# Patient Record
Sex: Female | Born: 1975 | Hispanic: No | Marital: Married | Smoking: Never smoker
Health system: Southern US, Community
[De-identification: ages and names within clinical notes are randomized; demographics above are authoritative.]

## PROBLEM LIST (undated history)

## (undated) DIAGNOSIS — D649 Anemia, unspecified: Secondary | ICD-10-CM

## (undated) DIAGNOSIS — I10 Essential (primary) hypertension: Secondary | ICD-10-CM

## (undated) DIAGNOSIS — E119 Type 2 diabetes mellitus without complications: Secondary | ICD-10-CM

## (undated) HISTORY — DX: Type 2 diabetes mellitus without complications: E11.9

## (undated) HISTORY — DX: Essential (primary) hypertension: I10

## (undated) HISTORY — DX: Anemia, unspecified: D64.9

---

## 2015-04-27 DIAGNOSIS — E119 Type 2 diabetes mellitus without complications: Secondary | ICD-10-CM

## 2015-04-27 HISTORY — DX: Type 2 diabetes mellitus without complications: E11.9

## 2015-04-27 HISTORY — PX: BREAST CYST ASPIRATION: SHX578

## 2015-08-27 ENCOUNTER — Other Ambulatory Visit: Payer: Self-pay | Admitting: Family Medicine

## 2015-08-27 DIAGNOSIS — N63 Unspecified lump in unspecified breast: Secondary | ICD-10-CM

## 2015-09-10 ENCOUNTER — Ambulatory Visit
Admission: RE | Admit: 2015-09-10 | Discharge: 2015-09-10 | Disposition: A | Discharge status: Home / Self Care | Payer: Self-pay | Source: Ambulatory Visit | Attending: Oncology | Admitting: Oncology

## 2015-09-10 ENCOUNTER — Ambulatory Visit: Payer: Self-pay | Attending: Oncology

## 2015-09-10 VITALS — BP 132/89 | HR 74 | Temp 98.1°F | Resp 18 | Ht 65.75 in | Wt 174.7 lb

## 2015-09-10 DIAGNOSIS — N63 Unspecified lump in unspecified breast: Secondary | ICD-10-CM

## 2015-09-10 NOTE — Progress Notes (Signed)
Subjective:     Patient ID: Kristi Compton, female   DOB: 24-Aug-1975, 40 y.o.   MRN: [id number]  HPI   Review of Systems     Objective:   Physical Exam  Pulmonary/Chest: Right breast exhibits no inverted nipple, no mass, no nipple discharge, no skin change and no tenderness. Left breast exhibits mass. Left breast exhibits no inverted nipple, no nipple discharge, no skin change and no tenderness. Breasts are symmetrical.         Assessment:     40 year old [facility].  Patient screened, and meets [facility] eligibility.  Patient does not have insurance, Medicare or Medicaid.  Handout given on Affordable Care Act. Instructed patient on breast self-exam using teach back method.  Palpated a 3cmx4cm left breast mass at 12 o'clock adjacent to areola.  Patient states she has notice for 3 weeks.  She also had a pap performed  At [facility], and was placed on Metronidazole, but states without relief.  Requested notes from that visit.  Joellyn QuailsChristy Burton to assist finding primary care physician to follow for continued vaginal itching , pain.     Plan:     Sent for bilateral diagnostic mammogram, and breast ultrasound.

## 2015-09-11 ENCOUNTER — Other Ambulatory Visit: Payer: Self-pay

## 2015-09-11 DIAGNOSIS — N63 Unspecified lump in unspecified breast: Secondary | ICD-10-CM

## 2015-09-19 ENCOUNTER — Ambulatory Visit
Admission: RE | Admit: 2015-09-19 | Discharge: 2015-09-19 | Disposition: A | Discharge status: Home / Self Care | Payer: Self-pay | Source: Ambulatory Visit | Attending: Oncology | Admitting: Oncology

## 2015-09-19 DIAGNOSIS — N63 Unspecified lump in unspecified breast: Secondary | ICD-10-CM

## 2015-09-24 ENCOUNTER — Ambulatory Visit: Payer: Self-pay

## 2015-09-25 ENCOUNTER — Ambulatory Visit: Payer: Self-pay

## 2015-10-06 ENCOUNTER — Encounter: Payer: Self-pay | Admitting: Obstetrics and Gynecology

## 2015-10-07 NOTE — Progress Notes (Signed)
Birads 2 mammogram, and FNA cyst aspiration results given to patient by radiologist.  She is given instruction to return in one year for annual screening, and mammogram. Copy to [facility].

## 2015-10-29 ENCOUNTER — Encounter: Payer: Self-pay | Admitting: Obstetrics and Gynecology

## 2016-09-15 ENCOUNTER — Ambulatory Visit
Admission: RE | Admit: 2016-09-15 | Discharge: 2016-09-15 | Disposition: A | Discharge status: Home / Self Care | Payer: Self-pay | Source: Ambulatory Visit | Attending: Oncology | Admitting: Oncology

## 2016-09-15 ENCOUNTER — Ambulatory Visit: Payer: Self-pay | Attending: Oncology

## 2016-09-15 ENCOUNTER — Encounter (INDEPENDENT_AMBULATORY_CARE_PROVIDER_SITE_OTHER): Payer: Self-pay

## 2016-09-15 VITALS — BP 135/95 | HR 83 | Temp 97.9°F | Resp 18 | Ht 66.0 in | Wt 175.0 lb

## 2016-09-15 DIAGNOSIS — Z0000 Encounter for general adult medical examination without abnormal findings: Secondary | ICD-10-CM

## 2016-09-15 NOTE — Progress Notes (Signed)
Subjective:     Patient ID: Kristi Compton, female   DOB: Jul 15, 1975, 41 y.o.   MRN: [id number]  HPI   Review of Systems     Objective:   Physical Exam  Pulmonary/Chest: Right breast exhibits mass. Right breast exhibits no inverted nipple, no nipple discharge, no skin change and no tenderness. Left breast exhibits no inverted nipple, no mass, no nipple discharge, no skin change and no tenderness. Breasts are symmetrical.    Right breast cysts upper outer quadrant        Assessment:     41 year old patient presents for [facility] clinic visit.  Noted bilateral breast cysts on previous mammogram. Patient had left breast cyst aspirated. Patient screened, and meets [facility] eligibility.  Patient does not have insurance, Medicare or Medicaid.  Handout given on Affordable Care Act. Instructed patient on breast self-exam using teach back method.  Palpated right breast cysts upper outer quadrant. Palpated bilateral symmetrical fibroglandular tissue surrounding areola.  Patient having generalized bilateral breast tenderness.    Reports having hot flashes.  States menstrual cycles are not always regular, and reports sometimes heavy bleeding.  Discussed perimenopause, and need to follow-up with GYN or primary physician.  Patient does not have primary physicia.  She is going to [place] to visit her ailing father.  Recommended she set up appointment with PCP when she returns.  Offered a list of primary care physicians. Plan:     Sent for bilateral screening mammogram.  Patient is having menstrual cycle, and will call Kristi Compton to schedule pap.

## 2016-09-21 NOTE — Progress Notes (Signed)
Letter mailed from [facility] to notify of normal mammogram results.  Patient to return in one year for annual screening.  Copy to [facility]. 

## 2017-06-10 IMAGING — US US BREAST*R* LIMITED INC AXILLA
1 series · 10 of 10 positions shown · non-contrast
Comparison: None.

CLINICAL DATA: 40-year-old female complaining of a palpable left
breast mass.

EXAM:
2D DIGITAL DIAGNOSTIC BILATERAL MAMMOGRAM WITH CAD AND ADJUNCT TOMO
ULTRASOUND BILATERAL BREAST

[Series 1: us breast*right* limited inc axilla · 0.08mm/px · 10 of 10 slices shown]
[im 1/10]
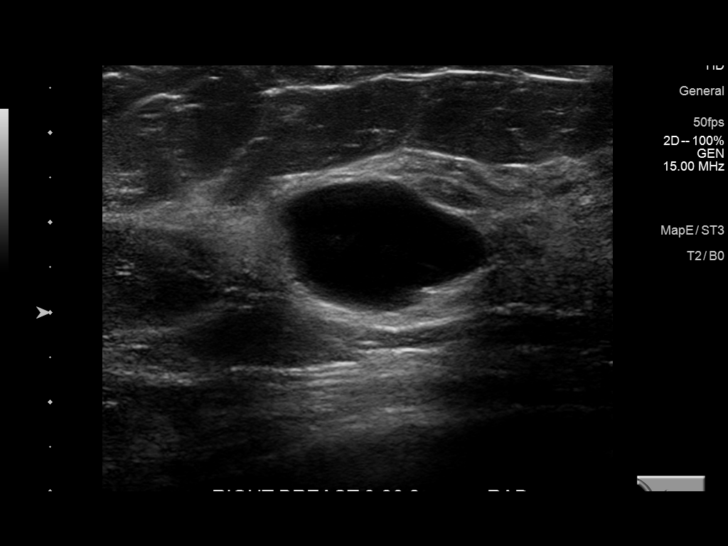
[im 2/10]
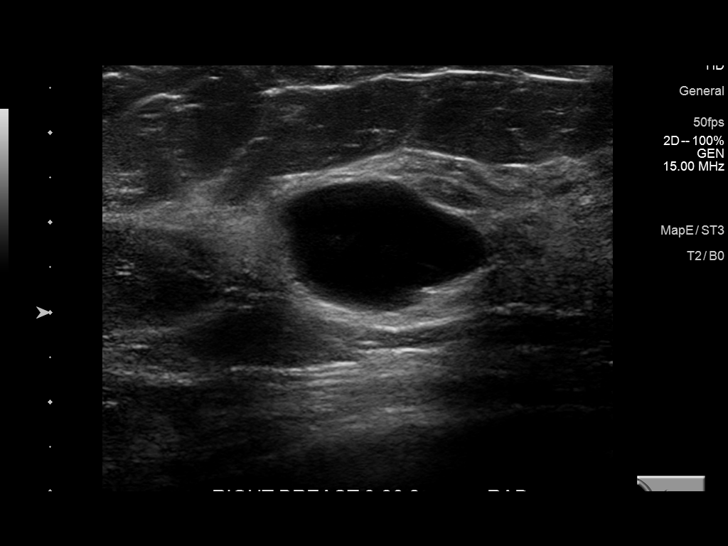
[im 3/10]
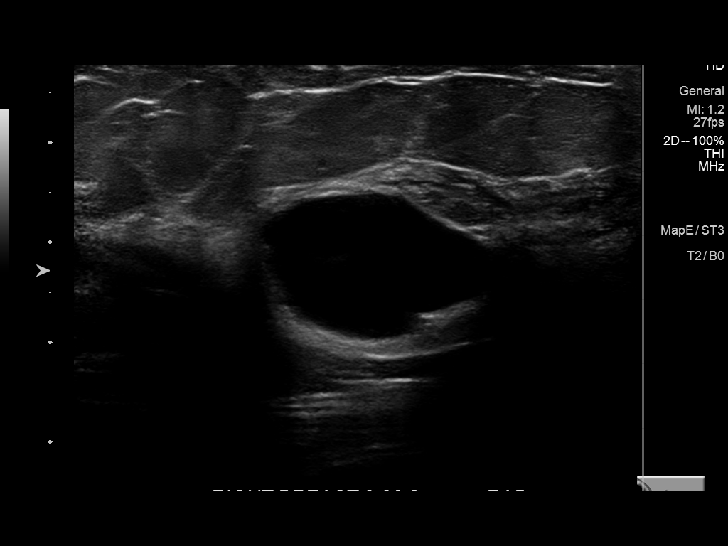
[im 4/10]
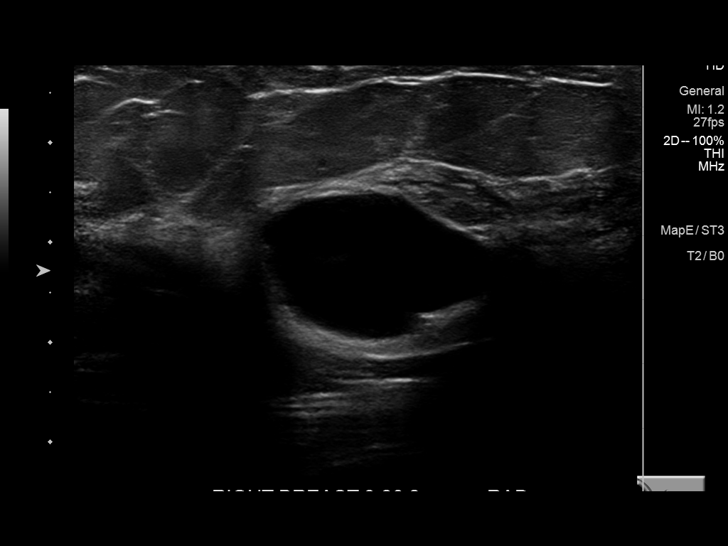
[im 5/10]
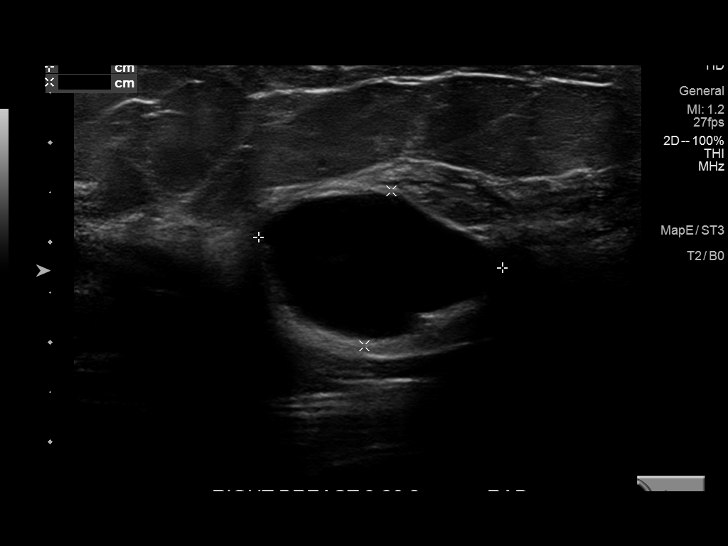
[im 6/10]
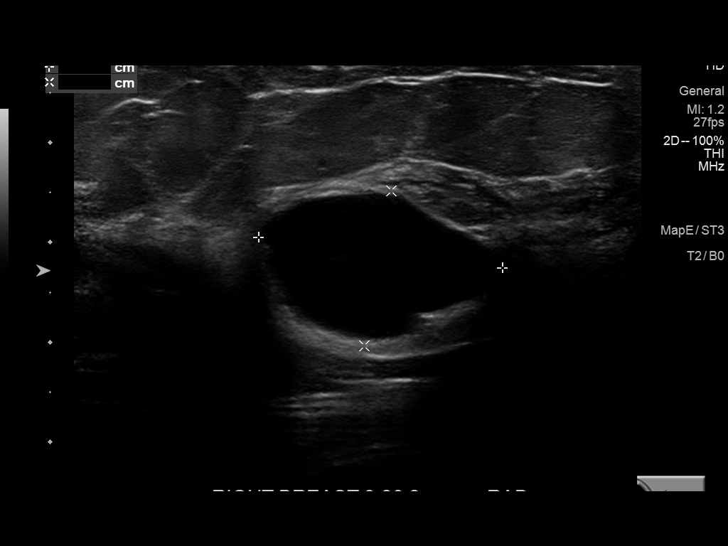
[im 7/10]
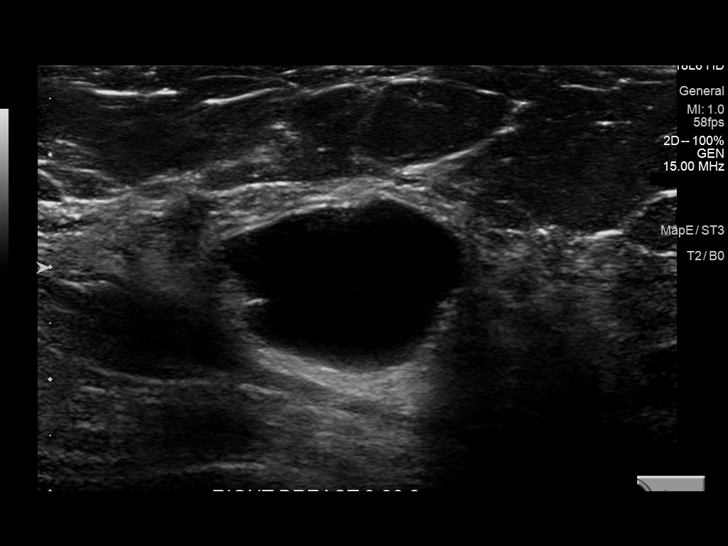
[im 8/10]
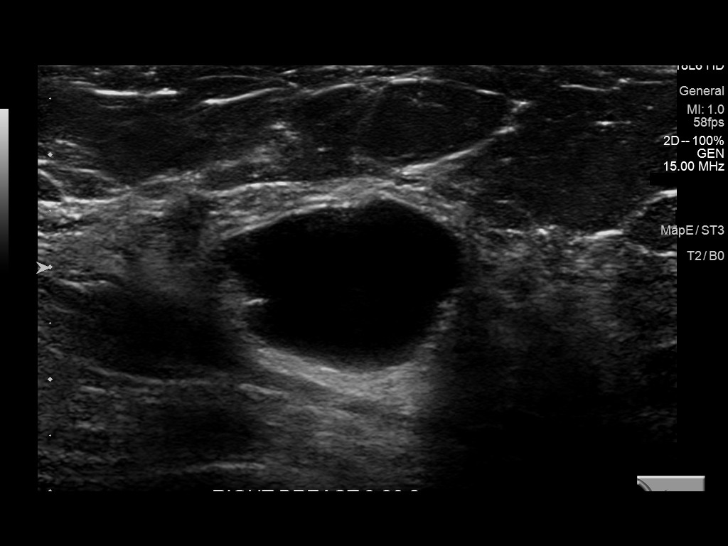
[im 9/10]
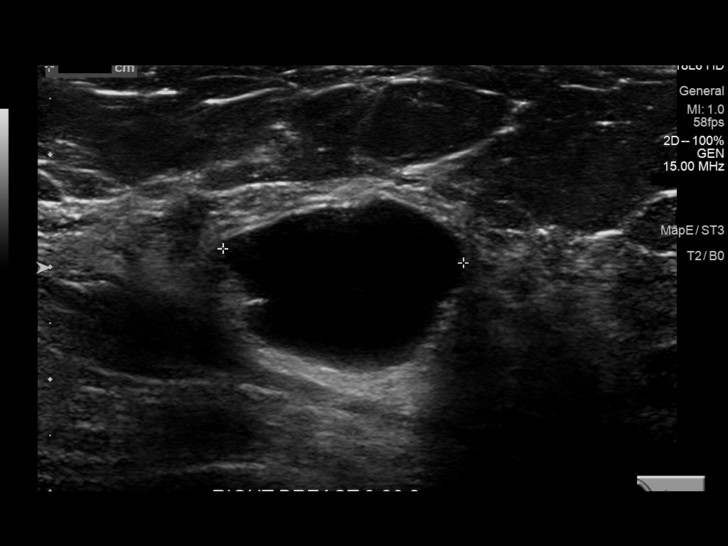
[im 10/10]
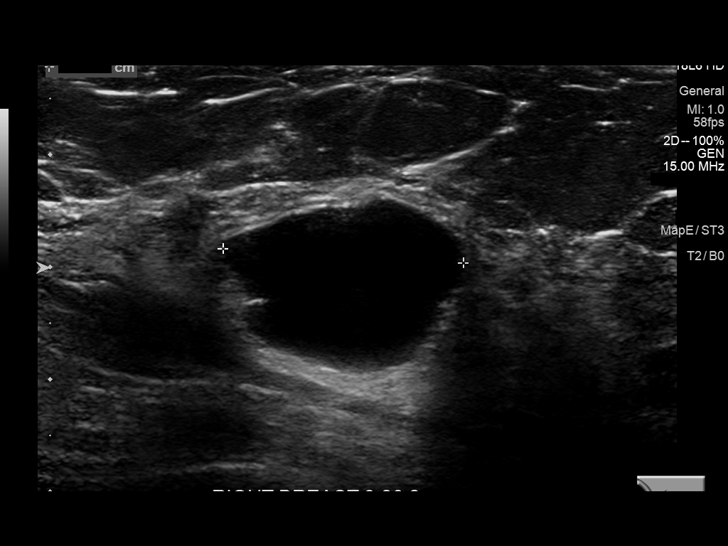

[10 of 10 positions shown; findings below may reference images not displayed]

ACR Breast Density Category c: The breast tissue is heterogeneously
dense, which may obscure small masses.
FINDINGS: There is a well-circumscribed obscured 2.4 cm mass in the
upper-outer quadrant of the right breast and a well circumscribed
obscured 3.4 cm mass in the 12 o'clock region of the left breast.
There are no malignant type microcalcifications in either breast.

Mammographic images were processed with CAD.

On physical exam, I do not palpate a mass in the upper-outer
quadrant of right breast. I palpate thickening in the left breast at
12 o'clock 3 cm from the nipple.

Targeted ultrasound is performed, showing a well-circumscribed
anechoic cyst in the right breast at 9 o'clock 8 cm from the nipple
measuring 2.5 x 1.6 x 2.1 cm. Sonographic evaluation of the left
breast shows a well-circumscribed anechoic cyst at 12 o'clock 3 cm
from the nipple measuring 3.1 x 2.9 x 3.0 cm. There is an adjacent
smaller cyst. No solid mass or abnormal shadowing detected in either
breast.
IMPRESSION: Bilateral breast cysts.  No evidence of malignancy.

RECOMMENDATION:
Bilateral screening mammogram in 1 year is recommended.

I have discussed the findings and recommendations with the patient.
Results were also provided in writing at the conclusion of the
visit. If applicable, a reminder letter will be sent to the patient
regarding the next appointment.

BI-RADS CATEGORY  2: Benign.

## 2017-09-21 ENCOUNTER — Ambulatory Visit: Payer: Self-pay

## 2017-10-05 ENCOUNTER — Ambulatory Visit: Payer: Self-pay | Attending: Oncology | Admitting: *Deleted

## 2017-10-05 VITALS — BP 158/112 | HR 82 | Temp 98.7°F | Ht 65.0 in | Wt 178.0 lb

## 2017-10-05 DIAGNOSIS — Z0000 Encounter for general adult medical examination without abnormal findings: Secondary | ICD-10-CM

## 2017-10-05 DIAGNOSIS — N63 Unspecified lump in unspecified breast: Secondary | ICD-10-CM

## 2017-10-05 NOTE — Patient Instructions (Signed)
HPV Test The human papillomavirus (HPV) test is used to look for high-risk types of HPV infection. HPV is a group of about 100 viruses. Many of these viruses cause growths on, in, or around the genitals. Most HPV viruses cause infections that usually go away without treatment. However, HPV types 6, 11, 16, and 18 are considered high-risk types of HPV that can increase your risk of cancer of the cervix or anus if the infection is left untreated. An HPV test identifies the DNA (genetic) strands of the HPV infection, so it is also referred to as the HPV DNA test. Although HPV is found in both males and females, the HPV test is only used to screen for increased cancer risk in females:  With an abnormal Pap test.  After treatment of an abnormal Pap test.  Between the ages of 53 and 66.  After treatment of a high-risk HPV infection.  The HPV test may be done at the same time as a pelvic exam and Pap test in females over the age of 61. Both the HPV test and Pap test require a sample of cells from the cervix. How do I prepare for this test?  Do not douche or take a bath for 24-48 hours before the test or as directed by your health care provider.  Do not have sex for 24-48 hours before the test or as directed by your health care provider.  You may be asked to reschedule the test if you are menstruating.  You will be asked to urinate before the test. What do the results mean? It is your responsibility to obtain your test results. Ask the lab or department performing the test when and how you will get your results. Talk with your health care provider if you have any questions about your results. Your result will be negative or positive. Meaning of Negative Test Results A negative HPV test result means that no HPV was found, and it is very likely that you do not have HPV. Meaning of Positive Test Results A positive HPV test result indicates that you have HPV.  If your test result shows the presence  of any high-risk HPV strains, you may have an increased risk of developing cancer of the cervix or anus if the infection is left untreated.  If any low-risk HPV strains are found, you are not likely to have an increased risk of cancer.  Discuss your test results with your health care provider. He or she will use the results to make a diagnosis and determine a treatment plan that is right for you. Talk with your health care provider to discuss your results, treatment options, and if necessary, the need for more tests. Talk with your health care provider if you have any questions about your results. This information is not intended to replace advice given to you by your health care provider. Make sure you discuss any questions you have with your health care provider. Document Released: 05/07/2004 Document Revised: 12/17/2015 Document Reviewed: 08/28/2013 Elsevier Interactive Patient Education  2018 [employer]. Hypertension Hypertension, commonly called high blood pressure, is when the force of blood pumping through the arteries is too strong. The arteries are the blood vessels that carry blood from the heart throughout the body. Hypertension forces the heart to work harder to pump blood and may cause arteries to become narrow or stiff. Having untreated or uncontrolled hypertension can cause heart attacks, strokes, kidney disease, and other problems. A blood pressure reading consists of a  higher number over a lower number. Ideally, your blood pressure should be below 120/80. The first ("top") number is called the systolic pressure. It is a measure of the pressure in your arteries as your heart beats. The second ("bottom") number is called the diastolic pressure. It is a measure of the pressure in your arteries as the heart relaxes. What are the causes? The cause of this condition is not known. What increases the risk? Some risk factors for high blood pressure are under your control. Others are  not. Factors you can change  Smoking.  Having type 2 diabetes mellitus, high cholesterol, or both.  Not getting enough exercise or physical activity.  Being overweight.  Having too much fat, sugar, calories, or salt (sodium) in your diet.  Drinking too much alcohol. Factors that are difficult or impossible to change  Having chronic kidney disease.  Having a family history of high blood pressure.  Age. Risk increases with age.  Race. You may be at higher risk if you are African-American.  Gender. Men are at higher risk than women before age 43. After age 43, women are at higher risk than men.  Having obstructive sleep apnea.  Stress. What are the signs or symptoms? Extremely high blood pressure (hypertensive crisis) may cause:  Headache.  Anxiety.  Shortness of breath.  Nosebleed.  Nausea and vomiting.  Severe chest pain.  Jerky movements you cannot control (seizures).  How is this diagnosed? This condition is diagnosed by measuring your blood pressure while you are seated, with your arm resting on a surface. The cuff of the blood pressure monitor will be placed directly against the skin of your upper arm at the level of your heart. It should be measured at least twice using the same arm. Certain conditions can cause a difference in blood pressure between your right and left arms. Certain factors can cause blood pressure readings to be lower or higher than normal (elevated) for a short period of time:  When your blood pressure is higher when you are in a health care provider's office than when you are at home, this is called white coat hypertension. Most people with this condition do not need medicines.  When your blood pressure is higher at home than when you are in a health care provider's office, this is called masked hypertension. Most people with this condition may need medicines to control blood pressure.  If you have a high blood pressure reading during one  visit or you have normal blood pressure with other risk factors:  You may be asked to return on a different day to have your blood pressure checked again.  You may be asked to monitor your blood pressure at home for 1 week or longer.  If you are diagnosed with hypertension, you may have other blood or imaging tests to help your health care provider understand your overall risk for other conditions. How is this treated? This condition is treated by making healthy lifestyle changes, such as eating healthy foods, exercising more, and reducing your alcohol intake. Your health care provider may prescribe medicine if lifestyle changes are not enough to get your blood pressure under control, and if:  Your systolic blood pressure is above 130.  Your diastolic blood pressure is above 80.  Your personal target blood pressure may vary depending on your medical conditions, your age, and other factors. Follow these instructions at home: Eating and drinking  Eat a diet that is high in fiber and potassium, and  low in sodium, added sugar, and fat. An example eating plan is called the DASH (Dietary Approaches to Stop Hypertension) diet. To eat this way: ? Eat plenty of fresh fruits and vegetables. Try to fill half of your plate at each meal with fruits and vegetables. ? Eat whole grains, such as whole wheat pasta, brown rice, or whole grain bread. Fill about one quarter of your plate with whole grains. ? Eat or drink low-fat dairy products, such as skim milk or low-fat yogurt. ? Avoid fatty cuts of meat, processed or cured meats, and poultry with skin. Fill about one quarter of your plate with lean proteins, such as fish, chicken without skin, beans, eggs, and tofu. ? Avoid premade and processed foods. These tend to be higher in sodium, added sugar, and fat.  Reduce your daily sodium intake. Most people with hypertension should eat less than 1,500 mg of sodium a day.  Limit alcohol intake to no more than 1  drink a day for nonpregnant women and 2 drinks a day for men. One drink equals 12 oz of beer, 5 oz of wine, or 1 oz of hard liquor. Lifestyle  Work with your health care provider to maintain a healthy body weight or to lose weight. Ask what an ideal weight is for you.  Get at least 30 minutes of exercise that causes your heart to beat faster (aerobic exercise) most days of the week. Activities may include walking, swimming, or biking.  Include exercise to strengthen your muscles (resistance exercise), such as pilates or lifting weights, as part of your weekly exercise routine. Try to do these types of exercises for 30 minutes at least 3 days a week.  Do not use any products that contain nicotine or tobacco, such as cigarettes and e-cigarettes. If you need help quitting, ask your health care provider.  Monitor your blood pressure at home as told by your health care provider.  Keep all follow-up visits as told by your health care provider. This is important. Medicines  Take over-the-counter and prescription medicines only as told by your health care provider. Follow directions carefully. Blood pressure medicines must be taken as prescribed.  Do not skip doses of blood pressure medicine. Doing this puts you at risk for problems and can make the medicine less effective.  Ask your health care provider about side effects or reactions to medicines that you should watch for. Contact a health care provider if:  You think you are having a reaction to a medicine you are taking.  You have headaches that keep coming back (recurring).  You feel dizzy.  You have swelling in your ankles.  You have trouble with your vision. Get help right away if:  You develop a severe headache or confusion.  You have unusual weakness or numbness.  You feel faint.  You have severe pain in your chest or abdomen.  You vomit repeatedly.  You have trouble breathing. Summary  Hypertension is when the force  of blood pumping through your arteries is too strong. If this condition is not controlled, it may put you at risk for serious complications.  Your personal target blood pressure may vary depending on your medical conditions, your age, and other factors. For most people, a normal blood pressure is less than 120/80.  Hypertension is treated with lifestyle changes, medicines, or a combination of both. Lifestyle changes include weight loss, eating a healthy, low-sodium diet, exercising more, and limiting alcohol. This information is not intended to replace advice  given to you by your health care provider. Make sure you discuss any questions you have with your health care provider. Document Released: 04/12/2005 Document Revised: 03/10/2016 Document Reviewed: 03/10/2016 Elsevier Interactive Patient Education  [employer]2018 Elsevier Inc.

## 2017-10-05 NOTE — Progress Notes (Signed)
  Subjective:     Patient ID: Kristi Compton, female   DOB: [birth year], 42 y.o.   MRN: [id number]  HPI   Review of Systems     Objective:   Physical Exam  Pulmonary/Chest: Right breast exhibits mass and tenderness. Right breast exhibits no inverted nipple, no nipple discharge and no skin change. Left breast exhibits tenderness. Left breast exhibits no inverted nipple, no mass, no nipple discharge and no skin change.    Abdominal: There is no splenomegaly or hepatomegaly.  Genitourinary: Rectal exam shows external hemorrhoid. No labial fusion. There is no rash, tenderness, lesion or injury on the right labia. There is no rash, tenderness, lesion or injury on the left labia. Uterus is not deviated, not enlarged, not fixed and not tender. Cervix exhibits no motion tenderness, no discharge and no friability. Right adnexum displays no mass, no tenderness and no fullness. Left adnexum displays no mass, no tenderness and no fullness. No erythema, tenderness or bleeding in the vagina. No foreign body in the vagina. No signs of injury around the vagina. Vaginal discharge found.  Genitourinary Comments: White non-odorous discharge noted       Assessment:     42 year old [place] from [place] [facility]  Complains of intermittent bilateral breast pain.  History of bilateral breast cysts with aspiration.  States "I can't sleep on my stomach" due to the pain.  On clinical breast exam I can palpate an approximate 3 cm tender mobile nodule at 9-10:00 right breast 4 cm from the areola.  Left lateral breast with tenderness on exam also.  Taught self breast awareness. The patient has never had a pap smear and is very anxious about having one completed today.  On pelvic exam I had difficulty visualizing the cervix.  Had second RN, Coralee Rudnne Shaver come in, and she was able to visualize the cervix.  Specimen collected for pap smear.  The patient refused rectal exam due to her hemorrhoids.   Blood pressure elevated at 158/112.  She is to take her blood pressure meds as soon as possible and then recheck her blood pressure at [facility] or [facility], and if remains higher than 140/90 she is to follow-up with her primary care provider.  Hand out on hypertention given to patient.  Patient has been screened for eligibility.  She does not have any insurance, Medicare or Medicaid.  She also meets financial eligibility.  Hand-out given on the Affordable Care Act.    Plan:     Bilateral diagnostic mammogram and ultrasound ordered.  Specimen for baseline pap sent to the lab.  To recheck her blood pressure.  Will follow-up per [facility] protocol.

## 2017-10-13 ENCOUNTER — Ambulatory Visit
Admission: RE | Admit: 2017-10-13 | Discharge: 2017-10-13 | Disposition: A | Discharge status: Home / Self Care | Payer: Self-pay | Source: Ambulatory Visit | Attending: Oncology | Admitting: Oncology

## 2017-10-13 DIAGNOSIS — N63 Unspecified lump in unspecified breast: Secondary | ICD-10-CM

## 2017-10-13 LAB — PAP LB AND HPV HIGH-RISK
HPV, HIGH-RISK: NEGATIVE
PAP Smear Comment: 0

## 2017-10-13 NOTE — Progress Notes (Signed)
Phoned patient with Birads 2 mammogram/ultrasound results showing bilateral cysts.  Patient is interested surgical consult for cyst aspiration if covered through [facility].  Given negative/negative pap results.  Next pap due in 5 years per [facility] guidelines.

## 2017-10-18 ENCOUNTER — Encounter: Payer: Self-pay | Admitting: *Deleted

## 2017-10-18 NOTE — Progress Notes (Signed)
Spoke to patient today.  She is still having a lot of breast pain.  Since her mammogram showed bilateral breast cysts, I feel it would be prudent to have her see a surgeon for possible cyst aspiration.  Patient is scheduled to see Dr. Lemar LivingsByrnett on 11/01/17 At 9:30.

## 2017-11-01 ENCOUNTER — Encounter: Payer: Self-pay | Admitting: General Surgery

## 2017-11-01 ENCOUNTER — Ambulatory Visit (INDEPENDENT_AMBULATORY_CARE_PROVIDER_SITE_OTHER): Payer: Self-pay | Admitting: General Surgery

## 2017-11-01 VITALS — BP 130/70 | HR 82 | Resp 12 | Ht 66.0 in | Wt 175.0 lb

## 2017-11-01 DIAGNOSIS — N644 Mastodynia: Secondary | ICD-10-CM

## 2017-11-01 NOTE — Progress Notes (Signed)
Patient ID: Kristi Compton, female   DOB: [birth year], 42 y.o.   MRN: [id number]  Chief Complaint  Patient presents with  . Mass    HPI Kristi Compton is a 42 y.o. female who presents for a breast evaluation. The most recent mammogram was done on 10/13/2017. Patient does perform regular self breast checks and gets regular mammograms done. In 2017, she had a left breast aspiration. The area was very painful.  She states she is has multiple cyst and they are painful sometime went she sleep. The breast are more tender in the last two years. She drinks a significant amount of tea, which she reports is common with mint and sweetened with sugar in her culture from [place].  She made it quite clear that her tea was more important than breast tenderness. HPI  Past Medical History:  Diagnosis Date  . Diabetes mellitus without complication (HCC) 2017  . Hypertension     Past Surgical History:  Procedure Laterality Date  . BREAST CYST ASPIRATION Left 2017    Family History  Problem Relation Age of Onset  . Breast cancer Neg Hx     Social History Social History   Tobacco Use  . Smoking status: Never Smoker  . Smokeless tobacco: Never Used  Substance Use Topics  . Alcohol use: Never    Frequency: Never  . Drug use: Not on file    No Known Allergies  Current Outpatient Medications  Medication Sig Dispense Refill  . metFORMIN (GLUCOPHAGE) 500 MG tablet Take 500 mg by mouth 2 (two) times daily with a meal.     No current facility-administered medications for this visit.     Review of Systems Review of Systems  Constitutional: Negative.   Respiratory: Negative.   Cardiovascular: Negative.     Blood pressure 130/70, pulse 82, resp. rate 12, height 5\' 6"  (1.676 m), weight 175 lb (79.4 kg), last menstrual period 10/11/2017.  Physical Exam Physical Exam  Constitutional: She is oriented to person, place, and time. She appears well-developed and well-nourished.  Eyes: Conjunctivae  are normal. No scleral icterus.  Neck: Neck supple.  Cardiovascular: Normal rate, regular rhythm and normal heart sounds.  Pulmonary/Chest: Effort normal and breath sounds normal. Right breast exhibits no inverted nipple, no nipple discharge, no skin change and no tenderness. Left breast exhibits no inverted nipple, no nipple discharge, no skin change and no tenderness.    Lymphadenopathy:    She has no cervical adenopathy.  Neurological: She is alert and oriented to person, place, and time.  Skin: Skin is warm and dry.    Data Reviewed November 12, 2017 bilateral mammograms and ultrasound reviewed.  Mammograms were benign, multiple cysts on ultrasound.  No direct correlation with areas of maximal tenderness in the left lateral breast.  Assessment    Mastalgia, fibrocystic changes.    Plan   Patient to take 1 Advil  a couple hours before bedtime. Try to wear a light bra at night to try to help the breast not move. Less caffeine.  Try an antioxidant vitamin BID for 3 months. Return as needed.    HPI, Physical Exam, Assessment and Plan have been scribed under the direction and in the presence of Donnalee CurryJeffrey Dahiana Kulak, MD.  Ples SpecterJessica Qualls, CMA  I have completed the exam and reviewed the above documentation for accuracy and completeness.  I agree with the above.  [employer] has been used and any errors in dictation or transcription are unintentional.  Donnalee CurryJeffrey Odai Wimmer, M.D., F.A.C.S.  Merrily Pew Branndon Tuite 11/02/2017, 5:35 PM

## 2017-11-01 NOTE — Patient Instructions (Addendum)
Patient to take 1 Advil  a couple hours before bedtime. Try to wear a light bra at night to try to help the breast not move. Less caffeine.  Try a vitamin like antioxidant vitamin.Return as needed.

## 2017-11-02 DIAGNOSIS — N644 Mastodynia: Secondary | ICD-10-CM | POA: Insufficient documentation

## 2017-11-03 ENCOUNTER — Encounter: Payer: Self-pay | Admitting: *Deleted

## 2017-11-03 NOTE — Progress Notes (Signed)
Patient saw Dr. Lemar LivingsByrnett for evaluation of breast pain.  To follow up in one year with annual screening.  [facility] to [place].

## 2018-01-10 IMAGING — US US BREAST*L* LIMITED INC AXILLA
1 series · 9 of 9 positions shown · non-contrast
Comparison: None.

CLINICAL DATA: 40-year-old female complaining of a palpable left
breast mass.

EXAM:
2D DIGITAL DIAGNOSTIC BILATERAL MAMMOGRAM WITH CAD AND ADJUNCT TOMO
ULTRASOUND BILATERAL BREAST

[Series 1: us breast*left* limited inc axilla · 0.09mm/px · 9 of 9 slices shown]
[im 1/9]
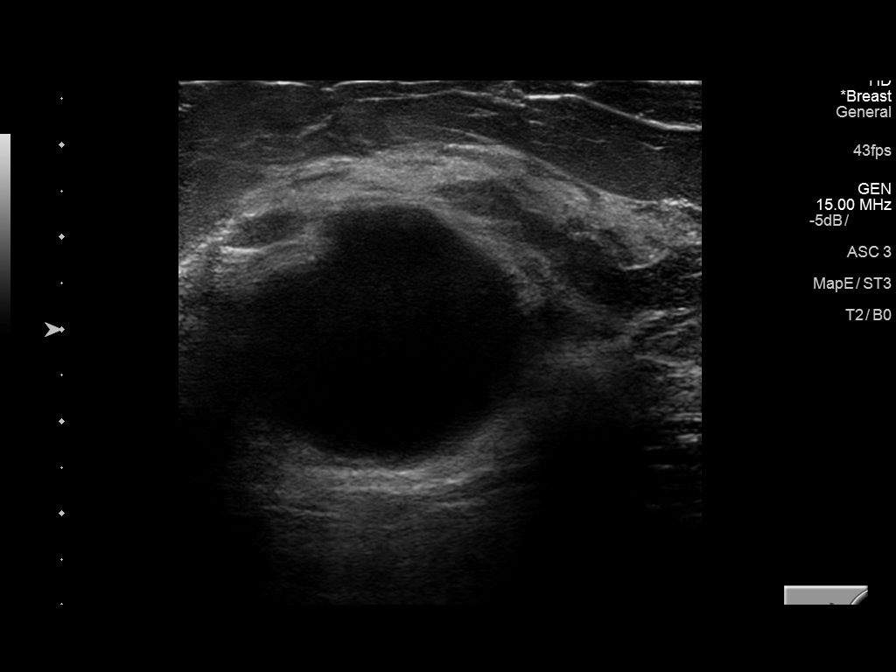
[im 2/9]
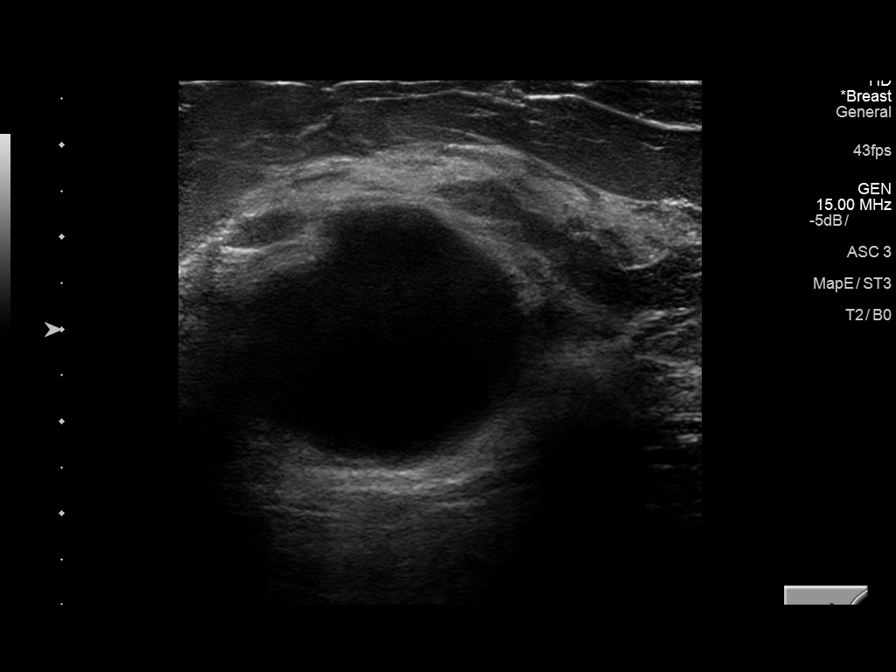
[im 3/9]
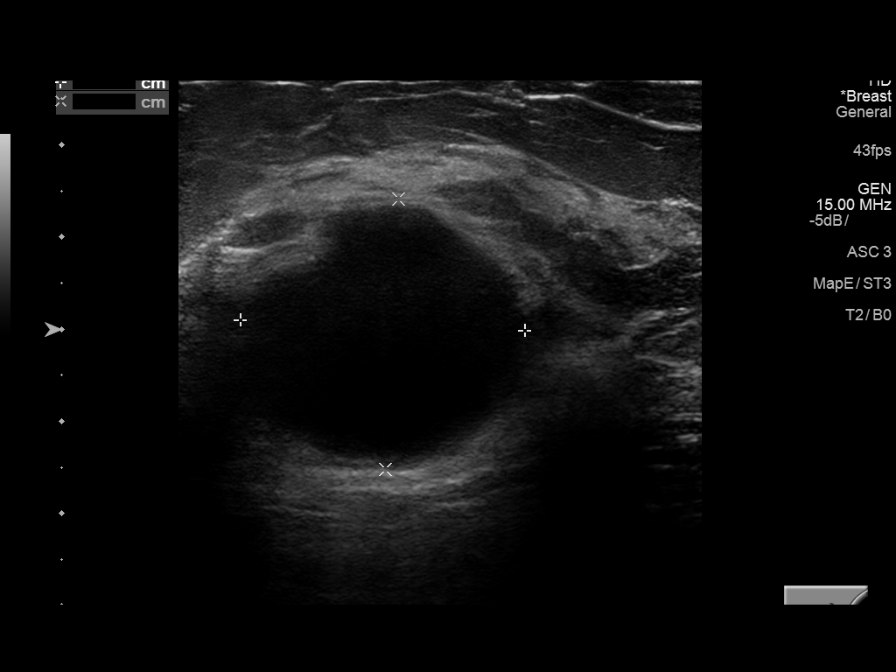
[im 4/9]
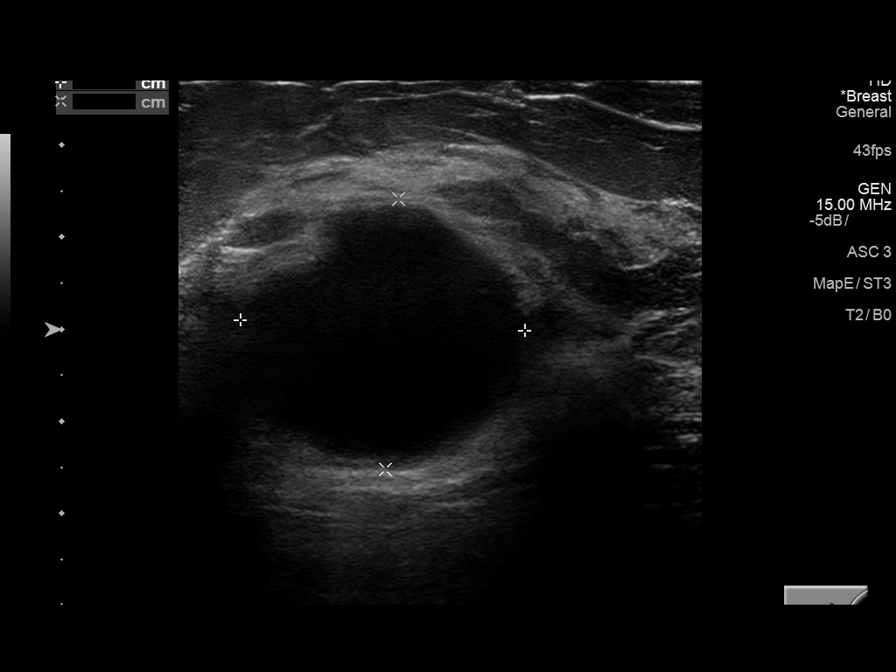
[im 5/9]
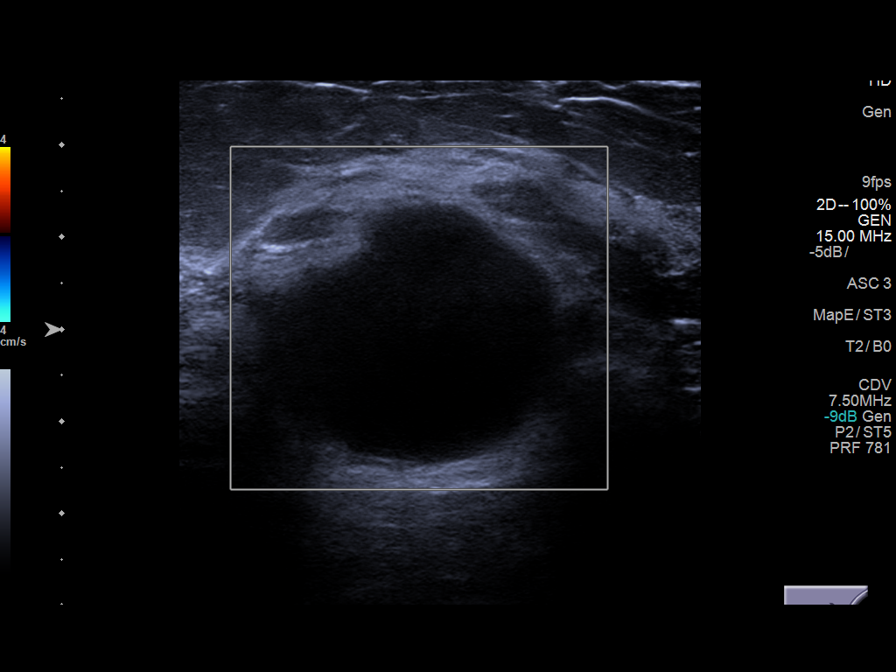
[im 6/9]
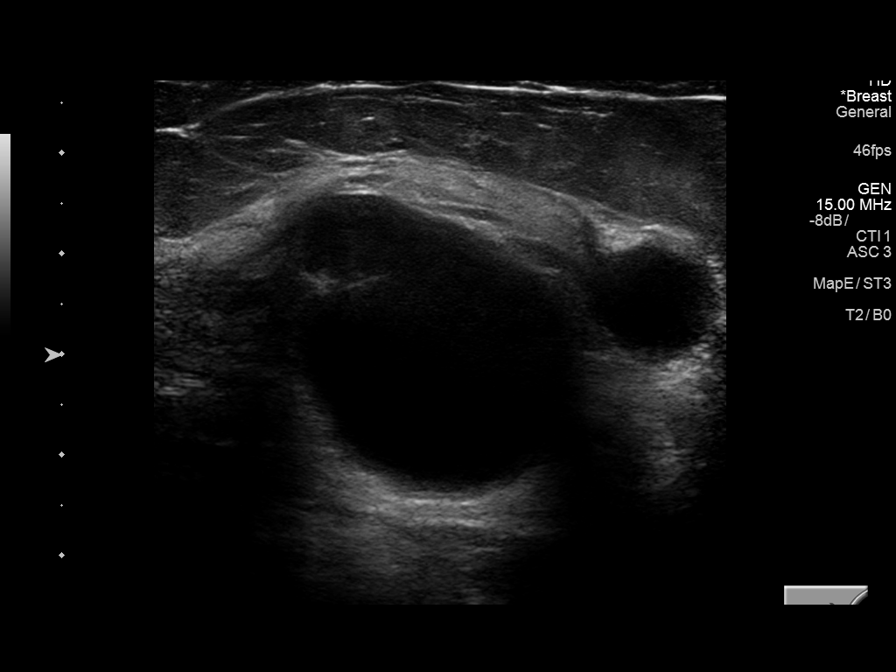
[im 7/9]
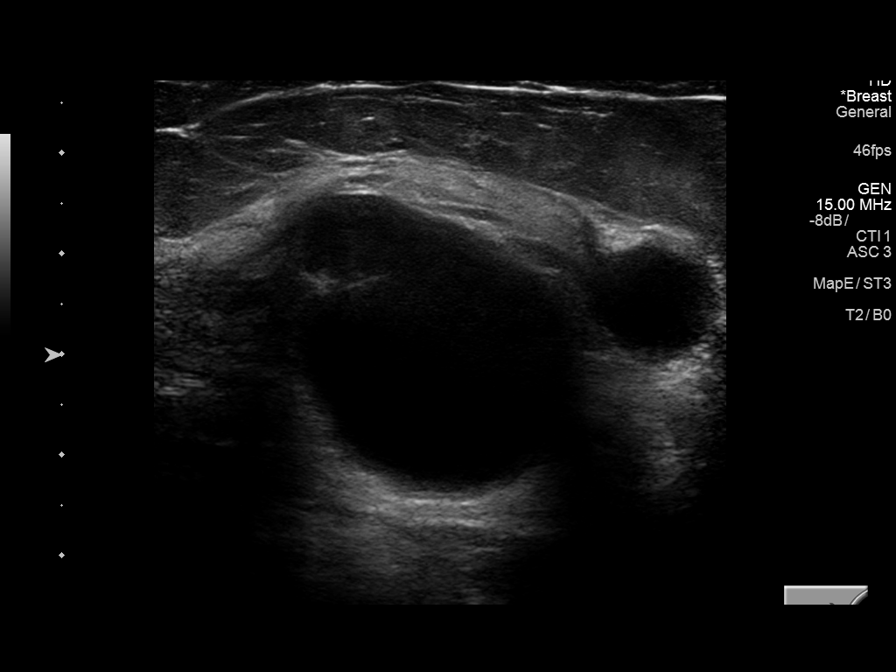
[im 8/9]
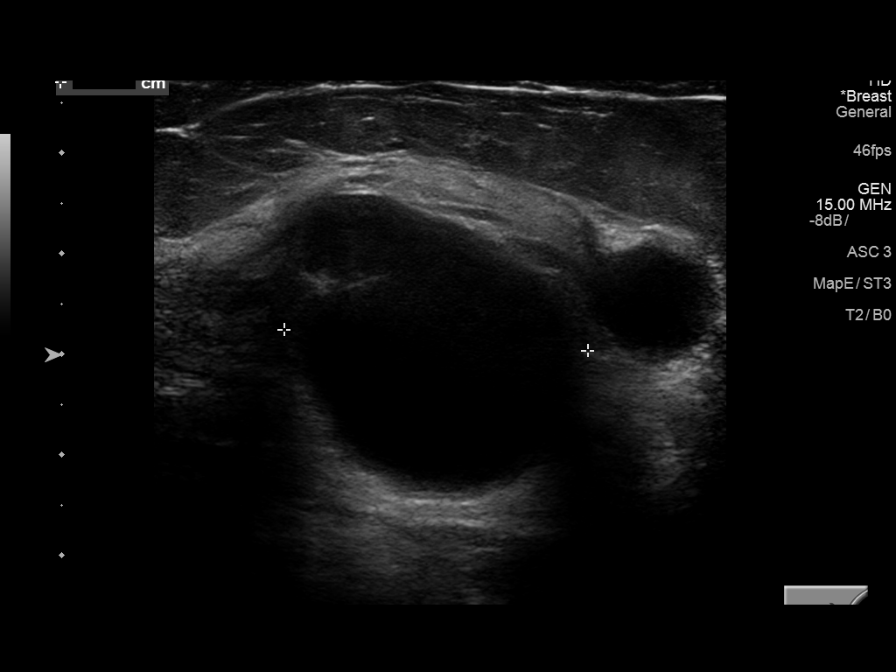
[im 9/9]
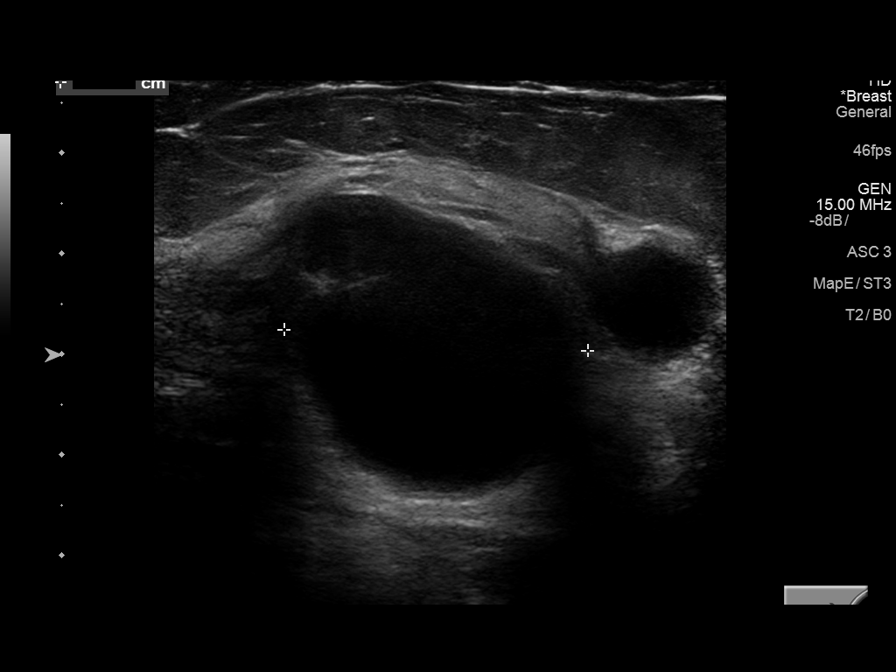

[9 of 9 positions shown; findings below may reference images not displayed]

ACR Breast Density Category c: The breast tissue is heterogeneously
dense, which may obscure small masses.
FINDINGS: There is a well-circumscribed obscured 2.4 cm mass in the
upper-outer quadrant of the right breast and a well circumscribed
obscured 3.4 cm mass in the 12 o'clock region of the left breast.
There are no malignant type microcalcifications in either breast.

Mammographic images were processed with CAD.

On physical exam, I do not palpate a mass in the upper-outer
quadrant of right breast. I palpate thickening in the left breast at
12 o'clock 3 cm from the nipple.

Targeted ultrasound is performed, showing a well-circumscribed
anechoic cyst in the right breast at 9 o'clock 8 cm from the nipple
measuring 2.5 x 1.6 x 2.1 cm. Sonographic evaluation of the left
breast shows a well-circumscribed anechoic cyst at 12 o'clock 3 cm
from the nipple measuring 3.1 x 2.9 x 3.0 cm. There is an adjacent
smaller cyst. No solid mass or abnormal shadowing detected in either
breast.
IMPRESSION: Bilateral breast cysts.  No evidence of malignancy.

RECOMMENDATION:
Bilateral screening mammogram in 1 year is recommended.

I have discussed the findings and recommendations with the patient.
Results were also provided in writing at the conclusion of the
visit. If applicable, a reminder letter will be sent to the patient
regarding the next appointment.

BI-RADS CATEGORY  2: Benign.

## 2018-02-22 ENCOUNTER — Encounter: Payer: Self-pay | Admitting: Internal Medicine

## 2018-02-25 ENCOUNTER — Ambulatory Visit: Payer: Self-pay | Admitting: Internal Medicine

## 2018-02-25 VITALS — BP 120/65 | Temp 98.7°F | Ht 66.0 in | Wt 178.0 lb

## 2018-02-25 DIAGNOSIS — E139 Other specified diabetes mellitus without complications: Secondary | ICD-10-CM

## 2018-02-25 DIAGNOSIS — E119 Type 2 diabetes mellitus without complications: Secondary | ICD-10-CM

## 2018-02-25 DIAGNOSIS — E785 Hyperlipidemia, unspecified: Secondary | ICD-10-CM

## 2018-02-25 MED ORDER — DOXYCYCLINE HYCLATE 100 MG PO TABS: 100.0000 mg | ORAL_TABLET | Freq: Two times a day (BID) | ORAL | 0 refills | Status: DC

## 2018-02-25 NOTE — Progress Notes (Signed)
H/o HTN and DM comes with 1 month h/o sore throat, occasionally spits up dark blood in the morning. Takes Aleve or Tylenol for pain relief sometimes.    Exam:  Mild redness with no signs of infection.  Plan; - She is a mouth breather, since going on for a month she wants to try antibiotic. Will give her Doxycycline.  - Routine labs since Diabetic and has hyperlipidemia

## 2018-03-14 ENCOUNTER — Encounter: Payer: Self-pay | Admitting: Internal Medicine

## 2018-04-01 ENCOUNTER — Encounter: Payer: Self-pay | Admitting: Family Medicine

## 2018-04-01 ENCOUNTER — Ambulatory Visit: Payer: Self-pay | Admitting: Family Medicine

## 2018-04-01 VITALS — BP 120/101 | HR 81 | Temp 98.4°F | Ht 66.0 in | Wt 178.8 lb

## 2018-04-01 DIAGNOSIS — E119 Type 2 diabetes mellitus without complications: Secondary | ICD-10-CM

## 2018-04-01 NOTE — Progress Notes (Signed)
  Subjective:     Patient ID: Kristi Compton, female   DOB: 1975/10/12, 42 y.o.   MRN: [id number]  HPI Patient here for follow up for diabetes. Says that she feels like she has to eat all the time. No weight loss or gain. A1C is 7.3. Taking metformin 2 tablets of 500 mg in the morning.   Review of Systems     Objective:   Physical Exam  Eyes: Pupils are equal, round, and reactive to light.  Cardiovascular: Regular rhythm.  Pulmonary/Chest: Effort normal.  Musculoskeletal: Normal range of motion.       Assessment:     diabetes    Plan:     A1C slightly above the desired. Increase exercise, may need to increase metformin If next A1C is still elevated.

## 2018-06-09 DIAGNOSIS — R059 Cough, unspecified: Secondary | ICD-10-CM | POA: Insufficient documentation

## 2018-06-09 DIAGNOSIS — R05 Cough: Secondary | ICD-10-CM | POA: Insufficient documentation

## 2019-03-02 DIAGNOSIS — I10 Essential (primary) hypertension: Secondary | ICD-10-CM | POA: Insufficient documentation

## 2019-03-02 DIAGNOSIS — E119 Type 2 diabetes mellitus without complications: Secondary | ICD-10-CM | POA: Insufficient documentation

## 2019-03-03 ENCOUNTER — Telehealth: Payer: Self-pay | Admitting: Internal Medicine

## 2019-03-03 ENCOUNTER — Ambulatory Visit: Payer: Self-pay | Admitting: Internal Medicine

## 2019-03-03 DIAGNOSIS — I10 Essential (primary) hypertension: Secondary | ICD-10-CM

## 2019-03-17 ENCOUNTER — Ambulatory Visit: Payer: Self-pay | Admitting: Internal Medicine

## 2019-03-28 ENCOUNTER — Other Ambulatory Visit: Payer: Self-pay | Admitting: Internal Medicine

## 2019-03-29 LAB — COMPREHENSIVE METABOLIC PANEL
ALT: 17 IU/L (ref 0–32)
AST: 19 IU/L (ref 0–40)
Albumin/Globulin Ratio: 1.5 (ref 1.2–2.2)
Albumin: 4.2 g/dL (ref 3.8–4.8)
Alkaline Phosphatase: 73 IU/L (ref 39–117)
BUN/Creatinine Ratio: 13 (ref 9–23)
BUN: 7 mg/dL (ref 6–24)
Bilirubin Total: 0.6 mg/dL (ref 0.0–1.2)
CO2: 23 mmol/L (ref 20–29)
Calcium: 9 mg/dL (ref 8.7–10.2)
Chloride: 100 mmol/L (ref 96–106)
Creatinine, Ser: 0.56 mg/dL — ABNORMAL LOW (ref 0.57–1.00)
GFR calc Af Amer: 131 mL/min/{1.73_m2} (ref >59)
GFR calc non Af Amer: 114 mL/min/{1.73_m2} (ref >59)
Globulin, Total: 2.8 g/dL (ref 1.5–4.5)
Glucose: 145 mg/dL — ABNORMAL HIGH (ref 65–99)
Potassium: 4.4 mmol/L (ref 3.5–5.2)
Sodium: 138 mmol/L (ref 134–144)
Total Protein: 7 g/dL (ref 6.0–8.5)

## 2019-03-29 LAB — CBC
Hematocrit: 34.5 % (ref 34.0–46.6)
Hemoglobin: 9.8 g/dL — ABNORMAL LOW (ref 11.1–15.9)
MCH: 19.5 pg — ABNORMAL LOW (ref 26.6–33.0)
MCHC: 28.4 g/dL — ABNORMAL LOW (ref 31.5–35.7)
MCV: 69 fL — ABNORMAL LOW (ref 79–97)
Platelets: 314 10*3/uL (ref 150–450)
RBC: 5.02 x10E6/uL (ref 3.77–5.28)
RDW: 19.1 % — ABNORMAL HIGH (ref 11.7–15.4)
WBC: 7.3 10*3/uL (ref 3.4–10.8)

## 2019-03-29 LAB — HGB A1C W/O EAG: Hgb A1c MFr Bld: 6.9 % — ABNORMAL HIGH (ref 4.8–5.6)

## 2019-03-29 LAB — TSH: TSH: 2.65 u[IU]/mL (ref 0.450–4.500)

## 2019-03-31 ENCOUNTER — Ambulatory Visit: Payer: Self-pay | Admitting: Internal Medicine

## 2019-03-31 DIAGNOSIS — E119 Type 2 diabetes mellitus without complications: Secondary | ICD-10-CM

## 2019-03-31 DIAGNOSIS — I10 Essential (primary) hypertension: Secondary | ICD-10-CM

## 2019-03-31 MED ORDER — METFORMIN HCL 500 MG PO TABS: 500.0000 mg | ORAL_TABLET | Freq: Two times a day (BID) | ORAL | 3 refills | Status: DC

## 2019-03-31 MED ORDER — LISINOPRIL 10 MG PO TABS: 10.0000 mg | ORAL_TABLET | Freq: Every day | ORAL | 3 refills | Status: DC

## 2019-03-31 NOTE — Progress Notes (Addendum)
Established Patient Office Visit  Subjective:  Patient ID: Kristi Compton, female    DOB: [birth year]  Age: 43 y.o. MRN: [id number]  CC: For follow-up of her diabetes and hypertension.  HPI Kristi Compton presents for for follow-up of her diabetes and hypertension.  She has stopped taking all her meds for about a year now.  She is trying to control her chronic conditions with diet and exercise.  She does not check her CBG at home. She do check her blood pressure occasionally at home.  Today it was 145/94.  Per patient it was much improved than her prior readings of 431-540 systolic. She has no other complaints.  Past Medical History:  Diagnosis Date  . Diabetes mellitus without complication ([place]) 0867  . Hypertension     Past Surgical History:  Procedure Laterality Date  . BREAST CYST ASPIRATION Left 2017    Family History  Problem Relation Age of Onset  . Breast cancer Neg Hx     Social History   Socioeconomic History  . Marital status: Married    Spouse name: Not on file  . Number of children: Not on file  . Years of education: Not on file  . Highest education level: Not on file  Occupational History  . Not on file  Social Needs  . Financial resource strain: Not on file  . Food insecurity    Worry: Not on file    Inability: Not on file  . Transportation needs    Medical: Not on file    Non-medical: Not on file  Tobacco Use  . Smoking status: Never Smoker  . Smokeless tobacco: Never Used  Substance and Sexual Activity  . Alcohol use: Never    Frequency: Never  . Drug use: Not on file  . Sexual activity: Not on file  Lifestyle  . Physical activity    Days per week: Not on file    Minutes per session: Not on file  . Stress: Not on file  Relationships  . Social Herbalist on phone: Not on file    Gets together: Not on file    Attends religious service: Not on file    Active member of club or organization: Not on file    Attends meetings of  clubs or organizations: Not on file    Relationship status: Not on file  . Intimate partner violence    Fear of current or ex partner: Not on file    Emotionally abused: Not on file    Physically abused: Not on file    Forced sexual activity: Not on file  Other Topics Concern  . Not on file  Social History Narrative  . Not on file    Outpatient Medications Prior to Visit  Medication Sig Dispense Refill  . doxycycline (VIBRA-TABS) 100 MG tablet Take 1 tablet (100 mg total) by mouth 2 (two) times daily. (Patient not taking: Reported on 04/01/2018) 14 tablet 0   No facility-administered medications prior to visit.     No Known Allergies  ROS Review of Systems    Objective:    Physical Exam  There were no vitals taken for this visit. Wt Readings from Last 3 Encounters:  07/09/17 180 lb 3.2 oz (81.7 kg)  04/01/18 178 lb 12.8 oz (81.1 kg)  03/14/18 178 lb (80.7 kg)     Health Maintenance Due  Topic Date Due  . PNEUMOCOCCAL POLYSACCHARIDE VACCINE AGE 69-64 HIGH RISK  06/11/1976  . FOOT  EXAM  06/11/1984  . OPHTHALMOLOGY EXAM  06/11/1984  . URINE MICROALBUMIN  06/11/1984  . HIV Screening  06/11/1989  . TETANUS/TDAP  06/11/1993  . INFLUENZA VACCINE  11/25/2018    There are no preventive care reminders to display for this patient.  Lab Results  Component Value Date   TSH 2.650 03/28/2019   Lab Results  Component Value Date   WBC 7.3 03/28/2019   HGB 9.8 (L) 03/28/2019   HCT 34.5 03/28/2019   MCV 69 (L) 03/28/2019   PLT 314 03/28/2019   Lab Results  Component Value Date   NA 138 03/28/2019   K 4.4 03/28/2019   CO2 23 03/28/2019   GLUCOSE 145 (H) 03/28/2019   BUN 7 03/28/2019   CREATININE 0.56 (L) 03/28/2019   BILITOT 0.6 03/28/2019   ALKPHOS 73 03/28/2019   AST 19 03/28/2019   ALT 17 03/28/2019   PROT 7.0 03/28/2019   ALBUMIN 4.2 03/28/2019   CALCIUM 9.0 03/28/2019   No results found for: CHOL No results found for: HDL No results found for:  LDLCALC No results found for: TRIG No results found for: [facility] Lab Results  Component Value Date   HGBA1C 6.9 (H) 03/28/2019      Assessment & Plan:   Diabetes.  Her A1c was 6.9.  No prior A1c in chart. That makes her in diabetes range. -Advised her to start Metformin 500 mg twice daily and send new prescriptions. -Repeat A1c, lipid profile and microalbumin urea in 52-month.  Hypertension.  According to her home check blood pressure, her blood pressure remained elevated.  She used to take lisinopril before which she has stopped taking for many months now. -Restart her on lisinopril 10 mg daily-we will titrate according to her response.  Microcytic anemia.  Patient had hemoglobin of 9.8 with MCV of 69. She denies any menorrhagia. -Check iron studies and ferritin level.  -Also advised to take flu shot.  No orders of the defined types were placed in this encounter.   Follow-up: In 2 to 71-month.   Arnetha Courser, MD

## 2019-06-30 ENCOUNTER — Ambulatory Visit: Payer: Self-pay | Admitting: Internal Medicine

## 2019-06-30 ENCOUNTER — Other Ambulatory Visit: Payer: Self-pay

## 2019-06-30 ENCOUNTER — Ambulatory Visit: Payer: Self-pay

## 2019-06-30 DIAGNOSIS — E119 Type 2 diabetes mellitus without complications: Secondary | ICD-10-CM

## 2019-06-30 DIAGNOSIS — D500 Iron deficiency anemia secondary to blood loss (chronic): Secondary | ICD-10-CM

## 2019-06-30 NOTE — Progress Notes (Signed)
Pt tends to skip breakfast and snacks a lot at night time. Set goal for more balanced eating throughout the day. (1) Set goal to have a balanced breakfast with protein and carb such as eggs and toast (2) Switch out sugar with agave and use 1/2 a teaspoon in tea or coffee (3) Set goal to have snack at work at 3-4 pm of fruit or 100 calorie nut pack (4) late night snack of popcorn (purchase 100 calorie pack) and if still hungry have 100 calorie nut pack (5) make sure to have plenty of water throughout the day Set appt in 3 months post-ramadan

## 2019-06-30 NOTE — Progress Notes (Signed)
[facility]   Internal MEDICINE  Telephone Visit  Patient Name: Kristi Compton  [id number]  [id number]  Date of Service: 06/30/2019  I connected with the patient at 1125 by telephone and verified the patients identity using two identifiers.   I discussed the limitations, risks, security and privacy concerns of performing an evaluation and management service by telephone and the availability of in person appointments. I also discussed with the patient that there may be a patient responsible charge related to the service.  The patient expressed understanding and agrees to proceed.    Chief Complaint  Patient presents with  . Diabetes  . Anemia    HPI Pt is connected via audio. She has h/o anemia and DM, she has stopped taking all her medications, she thinks her numbers are under good control, last hg a1c is 6.9 without any medications 03/28/2019 Her hg was 9.6, no iron studies available, h/o heavy cycles   Current Medication: Outpatient Encounter Medications as of 06/30/2019  Medication Sig  . [DISCONTINUED] doxycycline (VIBRA-TABS) 100 MG tablet Take 1 tablet (100 mg total) by mouth 2 (two) times daily. (Patient not taking: Reported on 04/01/2018)  . [DISCONTINUED] lisinopril (ZESTRIL) 10 MG tablet Take 1 tablet (10 mg total) by mouth daily.  . [DISCONTINUED] metFORMIN (GLUCOPHAGE) 500 MG tablet Take 1 tablet (500 mg total) by mouth 2 (two) times daily with a meal.   No facility-administered encounter medications on file as of 06/30/2019.    Surgical History: Past Surgical History:  Procedure Laterality Date  . BREAST CYST ASPIRATION Left 2017    Medical History: Past Medical History:  Diagnosis Date  . Diabetes mellitus without complication ([facility]) 2017  . Hypertension     Family History: Family History  Problem Relation Age of Onset  . Breast cancer Neg Hx     Social History   Socioeconomic History  . Marital status: Married    Spouse name: Not on file  . Number of  children: Not on file  . Years of education: Not on file  . Highest education level: Not on file  Occupational History  . Not on file  Tobacco Use  . Smoking status: Never Smoker  . Smokeless tobacco: Never Used  Substance and Sexual Activity  . Alcohol use: Never  . Drug use: Not on file  . Sexual activity: Not on file  Other Topics Concern  . Not on file  Social History Narrative  . Not on file   Social Determinants of Health   Financial Resource Strain:   . Difficulty of Paying Living Expenses: Not on file  Food Insecurity:   . Worried About Programme researcher, broadcasting/film/video in the Last Year: Not on file  . Ran Out of Food in the Last Year: Not on file  Transportation Needs:   . Lack of Transportation (Medical): Not on file  . Lack of Transportation (Non-Medical): Not on file  Physical Activity:   . Days of Exercise per Week: Not on file  . Minutes of Exercise per Session: Not on file  Stress:   . Feeling of Stress : Not on file  Social Connections:   . Frequency of Communication with Friends and Family: Not on file  . Frequency of Social Gatherings with Friends and Family: Not on file  . Attends Religious Services: Not on file  . Active Member of Clubs or Organizations: Not on file  . Attends Banker Meetings: Not on file  . Marital Status: Not on  file  Intimate Partner Violence:   . Fear of Current or Ex-Partner: Not on file  . Emotionally Abused: Not on file  . Physically Abused: Not on file  . Sexually Abused: Not on file    Review of Systems  Constitutional: Negative.   Respiratory: Negative.   Cardiovascular: Negative.   Endocrine: Negative.     Vital Signs: There were no vitals taken for this visit.   Observation/Objective: Pt is pleasant to speak with, NAD  Assessment/Plan: 1. Diet-controlled type 2 diabetes mellitus ([place]) Will recheck her hg a1c, and decide, pt is instructed to watch her diet   2. Iron deficiency anemia due to chronic blood  loss Repeat CBC, Ferritin and iron studies  General Counseling: Hajra verbalizes understanding of the findings of today's phone visit and agrees with plan of treatment. I have discussed any further diagnostic evaluation that may be needed or ordered today. We also reviewed her medications today. she has been encouraged to call the office with any questions or concerns that should arise related to todays visit.   Time spent: 74 Minutes  Dr Lavera Guise Internal medicine

## 2019-08-02 ENCOUNTER — Other Ambulatory Visit: Payer: Self-pay | Admitting: Internal Medicine

## 2019-08-03 LAB — FERRITIN: Ferritin: 8 ng/mL — ABNORMAL LOW (ref 15–150)

## 2019-08-03 LAB — CBC WITH DIFFERENTIAL/PLATELET
Basophils Absolute: 0.1 10*3/uL (ref 0.0–0.2)
Basos: 1 %
EOS (ABSOLUTE): 0.2 10*3/uL (ref 0.0–0.4)
Eos: 3 %
Hematocrit: 33 % — ABNORMAL LOW (ref 34.0–46.6)
Hemoglobin: 9.6 g/dL — ABNORMAL LOW (ref 11.1–15.9)
Immature Grans (Abs): 0 10*3/uL (ref 0.0–0.1)
Immature Granulocytes: 0 %
Lymphocytes Absolute: 3.2 10*3/uL — ABNORMAL HIGH (ref 0.7–3.1)
Lymphs: 42 %
MCH: 19.5 pg — ABNORMAL LOW (ref 26.6–33.0)
MCHC: 29.1 g/dL — ABNORMAL LOW (ref 31.5–35.7)
MCV: 67 fL — ABNORMAL LOW (ref 79–97)
Monocytes Absolute: 0.5 10*3/uL (ref 0.1–0.9)
Monocytes: 7 %
Neutrophils Absolute: 3.6 10*3/uL (ref 1.4–7.0)
Neutrophils: 47 %
Platelets: 304 10*3/uL (ref 150–450)
RBC: 4.93 x10E6/uL (ref 3.77–5.28)
RDW: 17.4 % — ABNORMAL HIGH (ref 11.7–15.4)
WBC: 7.5 10*3/uL (ref 3.4–10.8)

## 2019-08-03 LAB — MICROALBUMIN, URINE: Microalbumin, Urine: 28.9 ug/mL

## 2019-08-03 LAB — HGB A1C W/O EAG: Hgb A1c MFr Bld: 8.5 % — ABNORMAL HIGH (ref 4.8–5.6)

## 2019-08-03 LAB — T4, FREE: Free T4: 1.15 ng/dL (ref 0.82–1.77)

## 2019-08-03 LAB — B12 AND FOLATE PANEL
Folate: 18 ng/mL (ref >3.0)
Vitamin B-12: 668 pg/mL (ref 232–1245)

## 2019-08-07 NOTE — Progress Notes (Signed)
Uncontrolled dm, IDA

## 2019-08-09 ENCOUNTER — Encounter: Payer: Self-pay | Admitting: Internal Medicine

## 2019-08-11 ENCOUNTER — Ambulatory Visit: Payer: Self-pay | Admitting: Internal Medicine

## 2019-08-11 ENCOUNTER — Other Ambulatory Visit: Payer: Self-pay

## 2019-08-11 ENCOUNTER — Encounter: Payer: Self-pay | Admitting: Internal Medicine

## 2019-08-11 DIAGNOSIS — D509 Iron deficiency anemia, unspecified: Secondary | ICD-10-CM | POA: Insufficient documentation

## 2019-08-11 DIAGNOSIS — E119 Type 2 diabetes mellitus without complications: Secondary | ICD-10-CM

## 2019-08-11 MED ORDER — METFORMIN HCL 500 MG PO TABS: 500.0000 mg | ORAL_TABLET | Freq: Two times a day (BID) | ORAL | 0 refills | Status: DC

## 2019-08-11 NOTE — Progress Notes (Signed)
Established Patient Office Visit  Subjective:  Patient ID: Kristi Compton, female    DOB: 1976-02-28  Age: 44 y.o. MRN: [id number]  CC: No chief complaint on file.   HPI Kristi Compton presents for follow up, admits to lethargy and poor compliance with medications and diet. Lab reviewed and notable for deterioration in diabetic control. Denies any overt bleeding from any orifices or excessive menstrual bleeding.  Past Medical History:  Diagnosis Date  . Anemia   . Diabetes mellitus without complication ([place]) 5701  . Hypertension     Past Surgical History:  Procedure Laterality Date  . BREAST CYST ASPIRATION Left 2017    Family History  Problem Relation Age of Onset  . Hypertension Mother   . Diabetes Father   . Breast cancer Neg Hx     Social History   Socioeconomic History  . Marital status: Married    Spouse name: Kristi Compton  . Number of children: 4  . Years of education: Not on file  . Highest education level: GED or equivalent  Occupational History  . Not on file  Tobacco Use  . Smoking status: Never Smoker  . Smokeless tobacco: Never Used  Substance and Sexual Activity  . Alcohol use: Never  . Drug use: Never  . Sexual activity: Not on file  Other Topics Concern  . Not on file  Social History Narrative  . Not on file   Social Determinants of Health   Financial Resource Strain:   . Difficulty of Paying Living Expenses:   Food Insecurity:   . Worried About Charity fundraiser in the Last Year:   . Arboriculturist in the Last Year:   Transportation Needs:   . Film/video editor (Medical):   Marland Kitchen Lack of Transportation (Non-Medical):   Physical Activity:   . Days of Exercise per Week:   . Minutes of Exercise per Session:   Stress:   . Feeling of Stress :   Social Connections:   . Frequency of Communication with Friends and Family:   . Frequency of Social Gatherings with Friends and Family:   . Attends Religious Services:   . Active  Member of Clubs or Organizations:   . Attends Archivist Meetings:   Marland Kitchen Marital Status:   Intimate Partner Violence:   . Fear of Current or Ex-Partner:   . Emotionally Abused:   Marland Kitchen Physically Abused:   . Sexually Abused:     No outpatient medications prior to visit.   No facility-administered medications prior to visit.    No Known Allergies  ROS Review of Systems    Objective:    Physical Exam  There were no vitals taken for this visit. Wt Readings from Last 3 Encounters:  07/09/17 180 lb 3.2 oz (81.7 kg)  04/01/18 178 lb 12.8 oz (81.1 kg)  03/14/18 178 lb (80.7 kg)     Health Maintenance Due  Topic Date Due  . PNEUMOCOCCAL POLYSACCHARIDE VACCINE AGE 100-64 HIGH RISK  Never done  . FOOT EXAM  Never done  . OPHTHALMOLOGY EXAM  Never done  . HIV Screening  Never done  . TETANUS/TDAP  Never done    There are no preventive care reminders to display for this patient.  Lab Results  Component Value Date   TSH 2.650 03/28/2019   Lab Results  Component Value Date   WBC 7.5 08/02/2019   HGB 9.6 (L) 08/02/2019   HCT 33.0 (L) 08/02/2019   MCV  67 (L) 08/02/2019   PLT 304 08/02/2019   Lab Results  Component Value Date   NA 138 03/28/2019   K 4.4 03/28/2019   CO2 23 03/28/2019   GLUCOSE 145 (H) 03/28/2019   BUN 7 03/28/2019   CREATININE 0.56 (L) 03/28/2019   BILITOT 0.6 03/28/2019   ALKPHOS 73 03/28/2019   AST 19 03/28/2019   ALT 17 03/28/2019   PROT 7.0 03/28/2019   ALBUMIN 4.2 03/28/2019   CALCIUM 9.0 03/28/2019   No results found for: CHOL No results found for: HDL No results found for: LDLCALC No results found for: TRIG No results found for: CHOLHDL Lab Results  Component Value Date   HGBA1C 8.5 (H) 08/02/2019      Assessment & Plan:   Problem List Items Addressed This Visit    None      No orders of the defined types were placed in this encounter. Restart metformin, advised on low calorie diet and increased exercise. Also take  otc iron and order occult stool  Follow-up: No follow-ups on file.  Fu 3 months with a1c and ferritin prior   Kristi Napoleon, MD

## 2019-11-10 ENCOUNTER — Ambulatory Visit: Payer: Self-pay | Admitting: Internal Medicine

## 2019-11-17 ENCOUNTER — Ambulatory Visit: Payer: Self-pay | Admitting: Internal Medicine

## 2019-11-17 ENCOUNTER — Ambulatory Visit: Payer: Self-pay

## 2019-12-01 ENCOUNTER — Ambulatory Visit: Payer: Self-pay

## 2019-12-01 ENCOUNTER — Ambulatory Visit: Payer: Self-pay | Admitting: Cardiovascular Disease

## 2019-12-12 ENCOUNTER — Other Ambulatory Visit: Payer: Self-pay | Admitting: Internal Medicine

## 2019-12-13 LAB — CBC
Hematocrit: 40.3 % (ref 34.0–46.6)
Hemoglobin: 12.1 g/dL (ref 11.1–15.9)
MCH: 21.9 pg — ABNORMAL LOW (ref 26.6–33.0)
MCHC: 30 g/dL — ABNORMAL LOW (ref 31.5–35.7)
MCV: 73 fL — ABNORMAL LOW (ref 79–97)
Platelets: 307 10*3/uL (ref 150–450)
RBC: 5.53 x10E6/uL — ABNORMAL HIGH (ref 3.77–5.28)
RDW: 17.4 % — ABNORMAL HIGH (ref 11.7–15.4)
WBC: 11 10*3/uL — ABNORMAL HIGH (ref 3.4–10.8)

## 2019-12-13 LAB — LIPID PANEL W/O CHOL/HDL RATIO
Cholesterol, Total: 191 mg/dL (ref 100–199)
HDL: 39 mg/dL — ABNORMAL LOW (ref >39)
LDL Chol Calc (NIH): 116 mg/dL — ABNORMAL HIGH (ref 0–99)
Triglycerides: 205 mg/dL — ABNORMAL HIGH (ref 0–149)
VLDL Cholesterol Cal: 36 mg/dL (ref 5–40)

## 2019-12-13 LAB — BASIC METABOLIC PANEL
BUN/Creatinine Ratio: 9 (ref 9–23)
BUN: 5 mg/dL — ABNORMAL LOW (ref 6–24)
CO2: 22 mmol/L (ref 20–29)
Calcium: 9.4 mg/dL (ref 8.7–10.2)
Chloride: 99 mmol/L (ref 96–106)
Creatinine, Ser: 0.54 mg/dL — ABNORMAL LOW (ref 0.57–1.00)
GFR calc Af Amer: 132 mL/min/{1.73_m2} (ref >59)
GFR calc non Af Amer: 114 mL/min/{1.73_m2} (ref >59)
Glucose: 121 mg/dL — ABNORMAL HIGH (ref 65–99)
Potassium: 4.4 mmol/L (ref 3.5–5.2)
Sodium: 136 mmol/L (ref 134–144)

## 2019-12-13 LAB — IRON AND TIBC
Iron Saturation: 5 % — CL (ref 15–55)
Iron: 21 ug/dL — ABNORMAL LOW (ref 27–159)
Total Iron Binding Capacity: 408 ug/dL (ref 250–450)
UIBC: 387 ug/dL (ref 131–425)

## 2019-12-13 LAB — MICROALBUMIN / CREATININE URINE RATIO
Creatinine, Urine: 153.3 mg/dL
Microalb/Creat Ratio: 12 mg/g creat (ref 0–29)
Microalbumin, Urine: 17.7 ug/mL

## 2019-12-13 LAB — FERRITIN: Ferritin: 10 ng/mL — ABNORMAL LOW (ref 15–150)

## 2019-12-13 LAB — HGB A1C W/O EAG: Hgb A1c MFr Bld: 6.9 % — ABNORMAL HIGH (ref 4.8–5.6)

## 2019-12-15 ENCOUNTER — Other Ambulatory Visit: Payer: Self-pay

## 2019-12-15 ENCOUNTER — Ambulatory Visit: Payer: Self-pay | Admitting: Internal Medicine

## 2019-12-15 DIAGNOSIS — D509 Iron deficiency anemia, unspecified: Secondary | ICD-10-CM

## 2019-12-15 DIAGNOSIS — E785 Hyperlipidemia, unspecified: Secondary | ICD-10-CM

## 2019-12-15 DIAGNOSIS — E119 Type 2 diabetes mellitus without complications: Secondary | ICD-10-CM

## 2019-12-15 MED ORDER — FERROUS FUMARATE-FOLIC ACID 324-1 MG PO TABS: ORAL_TABLET | ORAL | 1 refills | Status: DC

## 2019-12-15 NOTE — Progress Notes (Signed)
Established Patient Office Visit  Subjective:  Patient ID: Gregg Holster, female    DOB: Jan 30, 1976  Age: 44 y.o. MRN: [id number]  CC:  Chief Complaint  Patient presents with  . Hot flashes in rectal area  . Hip Pain    Pain towards the front of the Right hip, feels a popping sensation  . Abnormal lab results    HPI Kristi Compton presents for lab work f/u and right hip pain which statred after she twisted it while in bed. Able to walk and climb stirs, hurts more at night. Taked Tylenol PM which help her sleep. Oveall pain is getting better. C/O "hot flashes" at her bottom areasince started having hip pain Very non specific description.   Past Medical History:  Diagnosis Date  . Anemia   . Diabetes mellitus without complication ([facility]) 2017  . Hypertension     Past Surgical History:  Procedure Laterality Date  . BREAST CYST ASPIRATION Left 2017    Family History  Problem Relation Age of Onset  . Hypertension Mother   . Diabetes Father   . Breast cancer Neg Hx     Social History   Socioeconomic History  . Marital status: Married    Spouse name: Zaira Iacovelli  . Number of children: 4  . Years of education: Not on file  . Highest education level: GED or equivalent  Occupational History  . Not on file  Tobacco Use  . Smoking status: Never Smoker  . Smokeless tobacco: Never Used  Vaping Use  . Vaping Use: Never used  Substance and Sexual Activity  . Alcohol use: Never  . Drug use: Never  . Sexual activity: Not on file  Other Topics Concern  . Not on file  Social History Narrative  . Not on file   Social Determinants of Health   Financial Resource Strain:   . Difficulty of Paying Living Expenses: Not on file  Food Insecurity:   . Worried About Programme researcher, broadcasting/film/video in the Last Year: Not on file  . Ran Out of Food in the Last Year: Not on file  Transportation Needs:   . Lack of Transportation (Medical): Not on file  . Lack of Transportation  (Non-Medical): Not on file  Physical Activity:   . Days of Exercise per Week: Not on file  . Minutes of Exercise per Session: Not on file  Stress:   . Feeling of Stress : Not on file  Social Connections:   . Frequency of Communication with Friends and Family: Not on file  . Frequency of Social Gatherings with Friends and Family: Not on file  . Attends Religious Services: Not on file  . Active Member of Clubs or Organizations: Not on file  . Attends Banker Meetings: Not on file  . Marital Status: Not on file  Intimate Partner Violence:   . Fear of Current or Ex-Partner: Not on file  . Emotionally Abused: Not on file  . Physically Abused: Not on file  . Sexually Abused: Not on file    Outpatient Medications Prior to Visit  Medication Sig Dispense Refill  . metFORMIN (GLUCOPHAGE) 500 MG tablet Take 1 tablet (500 mg total) by mouth 2 (two) times daily with a meal. 180 tablet 0   No facility-administered medications prior to visit.    No Known Allergies  ROS Review of Systems   As per HPI   Objective:    Physical Exam  There were no vitals taken for  this visit. Wt Readings from Last 3 Encounters:  07/09/17 180 lb 3.2 oz (81.7 kg)  04/01/18 178 lb 12.8 oz (81.1 kg)  03/14/18 178 lb (80.7 kg)     Health Maintenance Due  Topic Date Due  . Hepatitis C Screening  Never done  . PNEUMOCOCCAL POLYSACCHARIDE VACCINE AGE 46-64 HIGH RISK  Never done  . FOOT EXAM  Never done  . OPHTHALMOLOGY EXAM  Never done  . COVID-19 Vaccine (1) Never done  . HIV Screening  Never done  . TETANUS/TDAP  Never done  . INFLUENZA VACCINE  11/25/2019    There are no preventive care reminders to display for this patient.  Lab Results  Component Value Date   TSH 2.650 03/28/2019   Lab Results  Component Value Date   WBC 11.0 (H) 12/12/2019   HGB 12.1 12/12/2019   HCT 40.3 12/12/2019   MCV 73 (L) 12/12/2019   PLT 307 12/12/2019   Lab Results  Component Value Date   NA  136 12/12/2019   K 4.4 12/12/2019   CO2 22 12/12/2019   GLUCOSE 121 (H) 12/12/2019   BUN 5 (L) 12/12/2019   CREATININE 0.54 (L) 12/12/2019   BILITOT 0.6 03/28/2019   ALKPHOS 73 03/28/2019   AST 19 03/28/2019   ALT 17 03/28/2019   PROT 7.0 03/28/2019   ALBUMIN 4.2 03/28/2019   CALCIUM 9.4 12/12/2019   Lab Results  Component Value Date   CHOL 191 12/12/2019   Lab Results  Component Value Date   HDL 39 (L) 12/12/2019   Lab Results  Component Value Date   LDLCALC 116 (H) 12/12/2019   Lab Results  Component Value Date   TRIG 205 (H) 12/12/2019   No results found for: CHOLHDL Lab Results  Component Value Date   HGBA1C 6.9 (H) 12/12/2019      Assessment & Plan:   Problem List Items Addressed This Visit    None     Iron deficiency Labs discussed and asked about Iron intake compliance. She sttes she does not like the taste but tries to take it at least 5 days a wk. Compliance re-enforced ans asked to increase dose it she is already taking it regularly. She asked if she could get injection.     DM- Takes Metformin but does not check CBG since machines broke. Monitoring and diet control encouraged.  HLD- Not on medicines, recommended low cholesterol diet. .     Meds ordered this encounter  Medications  . Ferrous Fumarate-Folic Acid 324-1 MG TABS    Sig: Take one tab po qd for iron    Dispense:  90 tablet    Refill:  1    Follow-up: in 3 months    Kirt Boys, MD

## 2020-01-12 ENCOUNTER — Ambulatory Visit: Payer: Self-pay | Admitting: Orthopedic Surgery

## 2020-01-12 ENCOUNTER — Ambulatory Visit: Payer: Self-pay | Admitting: Internal Medicine

## 2020-01-12 ENCOUNTER — Ambulatory Visit: Payer: Self-pay

## 2020-03-01 ENCOUNTER — Ambulatory Visit: Payer: Self-pay | Admitting: Internal Medicine

## 2020-03-15 ENCOUNTER — Ambulatory Visit: Payer: Self-pay | Admitting: Cardiovascular Disease

## 2020-04-09 ENCOUNTER — Other Ambulatory Visit: Payer: Self-pay | Admitting: Internal Medicine

## 2020-04-12 ENCOUNTER — Ambulatory Visit: Payer: Self-pay | Admitting: Internal Medicine

## 2020-04-12 ENCOUNTER — Other Ambulatory Visit: Payer: Self-pay

## 2020-04-12 DIAGNOSIS — E119 Type 2 diabetes mellitus without complications: Secondary | ICD-10-CM

## 2020-04-12 DIAGNOSIS — R140 Abdominal distension (gaseous): Secondary | ICD-10-CM | POA: Insufficient documentation

## 2020-04-12 NOTE — Assessment & Plan Note (Signed)
We obtained complete blood test, reports are not currently available, needs follow-up for diabetes, needs an eye appointment, in-person physical exam on breast with Dr. Clydie Braun, Check her feet in-Person, Patient has been vaccinated

## 2020-04-12 NOTE — Progress Notes (Signed)
Established Patient Office Visit  Subjective:  Patient ID: Kristi Compton, female    DOB: 1976-01-11  Age: 44 y.o. MRN: [id number]  CC: No chief complaint on file.   HPI  Ermal Brzozowski presents for report of breast mass, mammogram was done which shows the cystic structure of the breast, suggests she follow up with D. Bhatti and needs a mammogram done on annual basis. She also complains of bloating and gas, we will send her info on Fodmap diet and Sibo diet to see if that will help her.  Past Medical History:  Diagnosis Date  . Anemia   . Diabetes mellitus without complication (HCC) 2017  . Hypertension     Past Surgical History:  Procedure Laterality Date  . BREAST CYST ASPIRATION Left 2017    Family History  Problem Relation Age of Onset  . Hypertension Mother   . Diabetes Father   . Breast cancer Neg Hx     Social History   Socioeconomic History  . Marital status: Married    Spouse name: Abbigael Detlefsen  . Number of children: 4  . Years of education: Not on file  . Highest education level: GED or equivalent  Occupational History  . Not on file  Tobacco Use  . Smoking status: Never Smoker  . Smokeless tobacco: Never Used  Vaping Use  . Vaping Use: Never used  Substance and Sexual Activity  . Alcohol use: Never  . Drug use: Never  . Sexual activity: Not on file  Other Topics Concern  . Not on file  Social History Narrative  . Not on file   Social Determinants of Health   Financial Resource Strain: Not on file  Food Insecurity: Not on file  Transportation Needs: Not on file  Physical Activity: Not on file  Stress: Not on file  Social Connections: Not on file  Intimate Partner Violence: Not on file     Current Outpatient Medications:  .  Ferrous Fumarate-Folic Acid 324-1 MG TABS, Take one tab po qd for iron, Disp: 90 tablet, Rfl: 1 .  metFORMIN (GLUCOPHAGE) 500 MG tablet, Take 1 tablet (500 mg total) by mouth 2 (two) times daily with a meal.,  Disp: 180 tablet, Rfl: 0   No Known Allergies  ROS Review of Systems  Constitutional: Negative.   HENT: Negative.   Eyes: Negative.   Respiratory: Negative.   Cardiovascular: Negative.   Gastrointestinal: Negative.        Patient complains of bloating, Patient also has a cyst in breast and mammogram was completed  Endocrine: Negative.   Genitourinary: Negative.   Musculoskeletal: Negative.   Skin: Negative.   Allergic/Immunologic: Negative.   Neurological: Negative.   Hematological: Negative.   Psychiatric/Behavioral: Negative.   All other systems reviewed and are negative.     Objective:    Physical Exam Not completed, Virtual Visit  There were no vitals taken for this visit. Wt Readings from Last 3 Encounters:  07/09/17 180 lb 3.2 oz (81.7 kg)  04/01/18 178 lb 12.8 oz (81.1 kg)  03/14/18 178 lb (80.7 kg)     Health Maintenance Due  Topic Date Due  . Hepatitis C Screening  Never done  . PNEUMOCOCCAL POLYSACCHARIDE VACCINE AGE 75-64 HIGH RISK  Never done  . COVID-19 Vaccine (1) Never done  . FOOT EXAM  Never done  . OPHTHALMOLOGY EXAM  Never done  . HIV Screening  Never done  . TETANUS/TDAP  Never done  . INFLUENZA VACCINE  Never  done    There are no preventive care reminders to display for this patient.  Lab Results  Component Value Date   TSH 2.650 03/28/2019   Lab Results  Component Value Date   WBC 11.0 (H) 12/12/2019   HGB 12.1 12/12/2019   HCT 40.3 12/12/2019   MCV 73 (L) 12/12/2019   PLT 307 12/12/2019   Lab Results  Component Value Date   NA 136 12/12/2019   K 4.4 12/12/2019   CO2 22 12/12/2019   GLUCOSE 121 (H) 12/12/2019   BUN 5 (L) 12/12/2019   CREATININE 0.54 (L) 12/12/2019   BILITOT 0.6 03/28/2019   ALKPHOS 73 03/28/2019   AST 19 03/28/2019   ALT 17 03/28/2019   PROT 7.0 03/28/2019   ALBUMIN 4.2 03/28/2019   CALCIUM 9.4 12/12/2019   Lab Results  Component Value Date   CHOL 191 12/12/2019   Lab Results  Component Value  Date   HDL 39 (L) 12/12/2019   Lab Results  Component Value Date   LDLCALC 116 (H) 12/12/2019   Lab Results  Component Value Date   TRIG 205 (H) 12/12/2019   No results found for: CHOLHDL Lab Results  Component Value Date   HGBA1C 6.9 (H) 12/12/2019      Assessment & Plan:   Problem List Items Addressed This Visit      Endocrine   Diabetes (HCC)    We obtained complete blood test, reports are not currently available, needs follow-up for diabetes, needs an eye appointment, in-person physical exam on breast with Dr. Clydie Braun, Check her feet in-Person, Patient has been vaccinated         Other   Bloating - Primary    Patient was suggested Fodmap diet and Sibo diet, Will send info to the patient, patient seems to have Irritable bowel syndrome. Was suggested that she should have a complete GI.         No orders of the defined types were placed in this encounter.   Follow-up: 1 month follow-up   Corky Downs, MD

## 2020-04-12 NOTE — Assessment & Plan Note (Signed)
Patient was suggested Fodmap diet and Sibo diet, Will send info to the patient, patient seems to have Irritable bowel syndrome. Was suggested that she should have a complete GI.

## 2020-04-14 LAB — CBC WITH DIFFERENTIAL/PLATELET
Basophils Absolute: 0.1 10*3/uL (ref 0.0–0.2)
Basos: 1 %
EOS (ABSOLUTE): 0.2 10*3/uL (ref 0.0–0.4)
Eos: 2 %
Hematocrit: 38.6 % (ref 34.0–46.6)
Hemoglobin: 12.8 g/dL (ref 11.1–15.9)
Immature Grans (Abs): 0 10*3/uL (ref 0.0–0.1)
Immature Granulocytes: 0 %
Lymphocytes Absolute: 3.4 10*3/uL — ABNORMAL HIGH (ref 0.7–3.1)
Lymphs: 41 %
MCH: 26.6 pg (ref 26.6–33.0)
MCHC: 33.2 g/dL (ref 31.5–35.7)
MCV: 80 fL (ref 79–97)
Monocytes Absolute: 0.5 10*3/uL (ref 0.1–0.9)
Monocytes: 6 %
Neutrophils Absolute: 4 10*3/uL (ref 1.4–7.0)
Neutrophils: 50 %
Platelets: 282 10*3/uL (ref 150–450)
RBC: 4.82 x10E6/uL (ref 3.77–5.28)
RDW: 15.5 % — ABNORMAL HIGH (ref 11.7–15.4)
WBC: 8.2 10*3/uL (ref 3.4–10.8)

## 2020-04-14 LAB — FERRITIN: Ferritin: 13 ng/mL — ABNORMAL LOW (ref 15–150)

## 2020-04-14 LAB — MICROALBUMIN / CREATININE URINE RATIO
Creatinine, Urine: 30 mg/dL
Microalb/Creat Ratio: 11 mg/g creat (ref 0–29)
Microalbumin, Urine: 3.4 ug/mL

## 2020-04-14 LAB — COMPREHENSIVE METABOLIC PANEL
ALT: 15 IU/L (ref 0–32)
AST: 16 IU/L (ref 0–40)
Albumin/Globulin Ratio: 1.8 (ref 1.2–2.2)
Albumin: 4.4 g/dL (ref 3.8–4.8)
Alkaline Phosphatase: 70 IU/L (ref 44–121)
BUN/Creatinine Ratio: 13 (ref 9–23)
BUN: 8 mg/dL (ref 6–24)
Bilirubin Total: 0.6 mg/dL (ref 0.0–1.2)
CO2: 23 mmol/L (ref 20–29)
Calcium: 9.4 mg/dL (ref 8.7–10.2)
Chloride: 98 mmol/L (ref 96–106)
Creatinine, Ser: 0.64 mg/dL (ref 0.57–1.00)
GFR calc Af Amer: 125 mL/min/{1.73_m2} (ref >59)
GFR calc non Af Amer: 108 mL/min/{1.73_m2} (ref >59)
Globulin, Total: 2.5 g/dL (ref 1.5–4.5)
Glucose: 169 mg/dL — ABNORMAL HIGH (ref 65–99)
Potassium: 4.6 mmol/L (ref 3.5–5.2)
Sodium: 136 mmol/L (ref 134–144)
Total Protein: 6.9 g/dL (ref 6.0–8.5)

## 2020-04-14 LAB — LIPID PANEL W/O CHOL/HDL RATIO
Cholesterol, Total: 175 mg/dL (ref 100–199)
HDL: 40 mg/dL (ref >39)
LDL Chol Calc (NIH): 103 mg/dL — ABNORMAL HIGH (ref 0–99)
Triglycerides: 184 mg/dL — ABNORMAL HIGH (ref 0–149)
VLDL Cholesterol Cal: 32 mg/dL (ref 5–40)

## 2020-04-14 LAB — URINALYSIS, ROUTINE W REFLEX MICROSCOPIC
Bilirubin, UA: NEGATIVE
Glucose, UA: NEGATIVE
Ketones, UA: NEGATIVE
Leukocytes,UA: NEGATIVE
Nitrite, UA: NEGATIVE
Protein,UA: NEGATIVE
RBC, UA: NEGATIVE
Specific Gravity, UA: 1.006 (ref 1.005–1.030)
Urobilinogen, Ur: 0.2 mg/dL (ref 0.2–1.0)
pH, UA: 6 (ref 5.0–7.5)

## 2020-04-14 LAB — B12 AND FOLATE PANEL
Folate: 14.5 ng/mL (ref >3.0)
Vitamin B-12: 557 pg/mL (ref 232–1245)

## 2020-04-14 LAB — URINE CULTURE

## 2020-04-14 LAB — VITAMIN D 25 HYDROXY (VIT D DEFICIENCY, FRACTURES): Vit D, 25-Hydroxy: 8 ng/mL — ABNORMAL LOW (ref 30.0–100.0)

## 2020-04-14 LAB — IRON AND TIBC
Iron Saturation: 9 % — CL (ref 15–55)
Iron: 32 ug/dL (ref 27–159)
Total Iron Binding Capacity: 373 ug/dL (ref 250–450)
UIBC: 341 ug/dL (ref 131–425)

## 2020-05-10 ENCOUNTER — Ambulatory Visit: Payer: Self-pay | Admitting: Internal Medicine

## 2020-05-17 ENCOUNTER — Ambulatory Visit: Payer: Self-pay | Admitting: Adult Health

## 2020-05-31 ENCOUNTER — Ambulatory Visit: Payer: Self-pay | Admitting: Internal Medicine

## 2020-05-31 ENCOUNTER — Other Ambulatory Visit: Payer: Self-pay

## 2020-05-31 DIAGNOSIS — E1169 Type 2 diabetes mellitus with other specified complication: Secondary | ICD-10-CM

## 2020-05-31 DIAGNOSIS — E785 Hyperlipidemia, unspecified: Secondary | ICD-10-CM

## 2020-05-31 MED ORDER — LISINOPRIL 5 MG PO TABS: 5.0000 mg | ORAL_TABLET | Freq: Every day | ORAL | 11 refills | Status: DC

## 2020-05-31 MED ORDER — ATORVASTATIN CALCIUM 10 MG PO TABS: 10.0000 mg | ORAL_TABLET | Freq: Every day | ORAL | 11 refills | Status: DC

## 2020-06-08 ENCOUNTER — Encounter: Payer: Self-pay | Admitting: Internal Medicine

## 2020-06-08 NOTE — Progress Notes (Unsigned)
Established Patient Office Visit  Subjective:  Patient ID: Kristi Compton, female    DOB: [birth year]  Age: 45 y.o. MRN: [id number]  CC: For follow-up of her chronic conditions.  HPI Kristi Compton presents for follow-up of her hypertension and diabetes.  She has no new complaint.  Per patient she is compliant with her medications.  She was just taking Metformin and iron supplement.  Not checking her blood pressure and CBG at home.  History of taking lisinopril in the past.  Past Medical History:  Diagnosis Date  . Anemia   . Diabetes mellitus without complication (HCC) 2017  . Hypertension     Past Surgical History:  Procedure Laterality Date  . BREAST CYST ASPIRATION Left 2017    Family History  Problem Relation Age of Onset  . Hypertension Mother   . Diabetes Father   . Breast cancer Neg Hx     Social History   Socioeconomic History  . Marital status: Married    Spouse name: Murle Hellstrom  . Number of children: 4  . Years of education: Not on file  . Highest education level: GED or equivalent  Occupational History  . Not on file  Tobacco Use  . Smoking status: Never Smoker  . Smokeless tobacco: Never Used  Vaping Use  . Vaping Use: Never used  Substance and Sexual Activity  . Alcohol use: Never  . Drug use: Never  . Sexual activity: Not on file  Other Topics Concern  . Not on file  Social History Narrative  . Not on file   Social Determinants of Health   Financial Resource Strain: Not on file  Food Insecurity: Not on file  Transportation Needs: Not on file  Physical Activity: Not on file  Stress: Not on file  Social Connections: Not on file  Intimate Partner Violence: Not on file    Outpatient Medications Prior to Visit  Medication Sig Dispense Refill  . Ferrous Fumarate-Folic Acid 324-1 MG TABS Take one tab po qd for iron 90 tablet 1  . metFORMIN (GLUCOPHAGE) 500 MG tablet Take 1 tablet (500 mg total) by mouth 2 (two) times daily with a meal.  180 tablet 0   No facility-administered medications prior to visit.    No Known Allergies  ROS Review of Systems Negative except mentioned in HPI   Objective:    Physical Exam  There were no vitals taken for this visit. Wt Readings from Last 3 Encounters:  07/09/17 180 lb 3.2 oz (81.7 kg)  04/01/18 178 lb 12.8 oz (81.1 kg)  03/14/18 178 lb (80.7 kg)   No physical exam done as it was a virtual visit.  Health Maintenance Due  Topic Date Due  . Hepatitis C Screening  Never done  . PNEUMOCOCCAL POLYSACCHARIDE VACCINE AGE 22-64 HIGH RISK  Never done  . COVID-19 Vaccine (1) Never done  . FOOT EXAM  Never done  . OPHTHALMOLOGY EXAM  Never done  . HIV Screening  Never done  . TETANUS/TDAP  Never done  . COLONOSCOPY (Pts 45-76yrs Insurance coverage will need to be confirmed)  Never done  . INFLUENZA VACCINE  Never done    There are no preventive care reminders to display for this patient.  Lab Results  Component Value Date   TSH 2.650 03/28/2019   Lab Results  Component Value Date   WBC 8.2 04/09/2020   HGB 12.8 04/09/2020   HCT 38.6 04/09/2020   MCV 80 04/09/2020   PLT 282 04/09/2020  Lab Results  Component Value Date   NA 136 04/09/2020   K 4.6 04/09/2020   CO2 23 04/09/2020   GLUCOSE 169 (H) 04/09/2020   BUN 8 04/09/2020   CREATININE 0.64 04/09/2020   BILITOT 0.6 04/09/2020   ALKPHOS 70 04/09/2020   AST 16 04/09/2020   ALT 15 04/09/2020   PROT 6.9 04/09/2020   ALBUMIN 4.4 04/09/2020   CALCIUM 9.4 04/09/2020   Lab Results  Component Value Date   CHOL 175 04/09/2020   Lab Results  Component Value Date   HDL 40 04/09/2020   Lab Results  Component Value Date   LDLCALC 103 (H) 04/09/2020   Lab Results  Component Value Date   TRIG 184 (H) 04/09/2020   No results found for: CHOLHDL Lab Results  Component Value Date   HGBA1C 6.9 (H) 12/12/2019      Assessment & Plan:   Patient with history of diabetes, prior A1c of 6.9, do not check her CBG  at home.  She will continue with Metformin and will need a repeat A1c during next visit.  Lipid profile with LDL of 103.  Gave her the refill for iron supplement and Metformin. Start her on lisinopril 10 mg daily and Lipitor 10 mg daily.   Problem List Items Addressed This Visit   None     Meds ordered this encounter  Medications  . atorvastatin (LIPITOR) 10 MG tablet    Sig: Take 1 tablet (10 mg total) by mouth daily.    Dispense:  30 tablet    Refill:  11  . lisinopril (ZESTRIL) 5 MG tablet    Sig: Take 1 tablet (5 mg total) by mouth daily.    Dispense:  30 tablet    Refill:  11    Follow-up: No follow-ups on file.    Arnetha Courser, MD

## 2020-06-11 ENCOUNTER — Other Ambulatory Visit: Payer: Self-pay | Admitting: Internal Medicine

## 2020-06-12 LAB — HGB A1C W/O EAG: Hgb A1c MFr Bld: 7.8 % — ABNORMAL HIGH (ref 4.8–5.6)

## 2020-06-14 ENCOUNTER — Ambulatory Visit: Payer: Self-pay | Admitting: Family Medicine

## 2020-06-14 ENCOUNTER — Other Ambulatory Visit: Payer: Self-pay

## 2020-06-14 DIAGNOSIS — E1169 Type 2 diabetes mellitus with other specified complication: Secondary | ICD-10-CM

## 2020-06-14 NOTE — Progress Notes (Signed)
45  yo female prsented  for follow up on her lab results She is feeling fine and has no complain On exam She sounds well on the phone  No respiratory distress A Type 2 Diabetes with hyperlipdemia A1c 7.8 not at goal  Plan Since patient just restart taking her metformin Will order lab and patient advised to get lab done on mid April so we medicine time to work

## 2020-07-12 ENCOUNTER — Ambulatory Visit: Payer: Self-pay | Admitting: Internal Medicine

## 2020-08-30 ENCOUNTER — Ambulatory Visit: Payer: Self-pay | Admitting: Family Medicine

## 2021-06-26 ENCOUNTER — Telehealth: Payer: Self-pay

## 2021-06-27 ENCOUNTER — Ambulatory Visit: Payer: Self-pay | Admitting: Internal Medicine

## 2021-06-27 ENCOUNTER — Other Ambulatory Visit: Payer: Self-pay

## 2021-06-27 ENCOUNTER — Encounter: Payer: Self-pay | Admitting: Internal Medicine

## 2021-06-27 VITALS — BP 165/109 | HR 103 | Temp 98.2°F | Ht 65.0 in | Wt 165.4 lb

## 2021-06-27 DIAGNOSIS — S46811A Strain of other muscles, fascia and tendons at shoulder and upper arm level, right arm, initial encounter: Secondary | ICD-10-CM

## 2021-06-27 DIAGNOSIS — B3731 Acute candidiasis of vulva and vagina: Secondary | ICD-10-CM

## 2021-06-27 MED ORDER — MELOXICAM 15 MG PO TABS: 15.0000 mg | ORAL_TABLET | Freq: Every day | ORAL | 0 refills | Status: DC

## 2021-06-27 MED ORDER — FLUCONAZOLE 150 MG PO TABS: 150.0000 mg | ORAL_TABLET | Freq: Once | ORAL | 0 refills | Status: AC

## 2021-06-27 MED ORDER — TIZANIDINE HCL 2 MG PO TABS: 2.0000 mg | ORAL_TABLET | Freq: Three times a day (TID) | ORAL | Status: AC | PRN

## 2021-06-27 NOTE — Progress Notes (Signed)
? ?Established Patient Office Visit ? ?Subjective:  ?Patient ID: Kristi Compton, female    DOB: 1976-04-18  Age: 46 y.o. MRN: [id number] ? ?CC:  ?Chief Complaint  ?Patient presents with  ? Shoulder Pain  ?  Pt reports pain on right shoulder   ? ? ?HPI ?Kristi Compton presents for 9 mo h/o right shoulder pain which has become progressively worse. Quite severe, no difficulty in movement but unable to lie on that side without analgesia in the evening and relieved by Tylenol PM. Denies any trauma or injury. Also c/o yeast infection. ? ?Past Medical History:  ?Diagnosis Date  ? Anemia   ? Diabetes mellitus without complication (HCC) 2017  ? Hypertension   ? ? ?Past Surgical History:  ?Procedure Laterality Date  ? BREAST CYST ASPIRATION Left 2017  ? ? ?Family History  ?Problem Relation Age of Onset  ? Hypertension Mother   ? Diabetes Father   ? Breast cancer Neg Hx   ? ? ?Social History  ? ?Socioeconomic History  ? Marital status: Married  ?  Spouse name: Kristi Compton  ? Number of children: 4  ? Years of education: Not on file  ? Highest education level: GED or equivalent  ?Occupational History  ? Not on file  ?Tobacco Use  ? Smoking status: Never  ? Smokeless tobacco: Never  ?Vaping Use  ? Vaping Use: Never used  ?Substance and Sexual Activity  ? Alcohol use: Never  ? Drug use: Never  ? Sexual activity: Not on file  ?Other Topics Concern  ? Not on file  ?Social History Narrative  ? Not on file  ? ?Social Determinants of Health  ? ?Financial Resource Strain: Not on file  ?Food Insecurity: Not on file  ?Transportation Needs: Not on file  ?Physical Activity: Not on file  ?Stress: Not on file  ?Social Connections: Not on file  ?Intimate Partner Violence: Not on file  ? ? ?Outpatient Medications Prior to Visit  ?Medication Sig Dispense Refill  ? atorvastatin (LIPITOR) 10 MG tablet Take 1 tablet (10 mg total) by mouth daily. 30 tablet 11  ? Ferrous Fumarate-Folic Acid 324-1 MG TABS Take one tab po qd for iron 90 tablet 1  ?  lisinopril (ZESTRIL) 5 MG tablet Take 1 tablet (5 mg total) by mouth daily. 30 tablet 11  ? metFORMIN (GLUCOPHAGE) 500 MG tablet Take 1 tablet (500 mg total) by mouth 2 (two) times daily with a meal. 180 tablet 0  ? ?No facility-administered medications prior to visit.  ? ? ?No Known Allergies ? ?ROS ?Review of Systems ? ?  ?Objective:  ?  ?Physical Exam ?HENT:  ?   Head: Normocephalic.  ?Cardiovascular:  ?   Rate and Rhythm: Normal rate and regular rhythm.  ?   Heart sounds: No murmur heard. ?Pulmonary:  ?   Breath sounds: Normal breath sounds. No rhonchi or rales.  ?Abdominal:  ?   Tenderness: Right CVA tenderness: vaginal candidiasis.  ?   Hernia: Hernia: trapezius sprain.  ?Musculoskeletal:  ?   Left shoulder: Tenderness present.  ?   Cervical back: Neck supple.  ?   Thoracic back: Spasms present.  ?     Back: ? ?   Comments: Right trapezius tenderness and spasm  ?Skin: ?   General: Skin is warm and dry.  ?Neurological:  ?   Mental Status: She is alert.  ? ? ?BP (!) 165/109 (BP Location: Left Arm, Patient Position: Sitting, Cuff Size: Normal)   Pulse Marland Kitchen)  103   Temp 98.2 ?F (36.8 ?C) (Oral)   Ht 5\' 5"  (1.651 m)   Wt 165 lb 6.4 oz (75 kg)   SpO2 98%   BMI 27.52 kg/m?  ?Wt Readings from Last 3 Encounters:  ?06/27/21 165 lb 6.4 oz (75 kg)  ?07/09/17 180 lb 3.2 oz (81.7 kg)  ?04/01/18 178 lb 12.8 oz (81.1 kg)  ? ? ? ?Health Maintenance Due  ?Topic Date Due  ? COVID-19 Vaccine (1) Never done  ? FOOT EXAM  Never done  ? OPHTHALMOLOGY EXAM  Never done  ? HIV Screening  Never done  ? Hepatitis C Screening  Never done  ? TETANUS/TDAP  Never done  ? COLONOSCOPY (Pts 45-45yrs Insurance coverage will need to be confirmed)  Never done  ? PAP SMEAR-Modifier  10/05/2020  ? INFLUENZA VACCINE  Never done  ? HEMOGLOBIN A1C  12/09/2020  ? URINE MICROALBUMIN  04/09/2021  ? ? ?There are no preventive care reminders to display for this patient. ? ?Lab Results  ?Component Value Date  ? TSH 2.650 03/28/2019  ? ?Lab Results   ?Component Value Date  ? WBC 8.2 04/09/2020  ? HGB 12.8 04/09/2020  ? HCT 38.6 04/09/2020  ? MCV 80 04/09/2020  ? PLT 282 04/09/2020  ? ?Lab Results  ?Component Value Date  ? NA 136 04/09/2020  ? K 4.6 04/09/2020  ? CO2 23 04/09/2020  ? GLUCOSE 169 (H) 04/09/2020  ? BUN 8 04/09/2020  ? CREATININE 0.64 04/09/2020  ? BILITOT 0.6 04/09/2020  ? ALKPHOS 70 04/09/2020  ? AST 16 04/09/2020  ? ALT 15 04/09/2020  ? PROT 6.9 04/09/2020  ? ALBUMIN 4.4 04/09/2020  ? CALCIUM 9.4 04/09/2020  ? ?Lab Results  ?Component Value Date  ? CHOL 175 04/09/2020  ? ?Lab Results  ?Component Value Date  ? HDL 40 04/09/2020  ? ?Lab Results  ?Component Value Date  ? LDLCALC 103 (H) 04/09/2020  ? ?Lab Results  ?Component Value Date  ? TRIG 184 (H) 04/09/2020  ? ?No results found for: CHOLHDL ?Lab Results  ?Component Value Date  ? HGBA1C 7.8 (H) 06/11/2020  ? ? ?  ?Assessment & Plan:  ?Trapezius spasm-order tizanidine and meloxicm ?Vaginal candida-oral fluconazole x1 ?Problem List Items Addressed This Visit   ?None ? ? ?No orders of the defined types were placed in this encounter. ? ? ?Follow-up: No follow-ups on file.  ? ? ?06/13/2020, MD ?

## 2021-07-11 ENCOUNTER — Ambulatory Visit: Payer: Self-pay

## 2021-07-11 ENCOUNTER — Other Ambulatory Visit: Payer: Self-pay

## 2021-07-11 NOTE — Progress Notes (Signed)
Patient has requested to reschedule appt and would like to have bloodwork prior to being seen. ?

## 2021-07-25 ENCOUNTER — Ambulatory Visit: Payer: Self-pay | Admitting: Cardiovascular Disease

## 2021-08-08 ENCOUNTER — Ambulatory Visit: Payer: Self-pay | Admitting: Internal Medicine

## 2021-08-29 ENCOUNTER — Ambulatory Visit: Payer: Self-pay | Admitting: Internal Medicine

## 2021-10-10 ENCOUNTER — Ambulatory Visit: Payer: Self-pay | Admitting: Cardiovascular Disease

## 2022-05-01 ENCOUNTER — Encounter: Payer: Self-pay | Admitting: Internal Medicine

## 2022-05-01 ENCOUNTER — Ambulatory Visit: Payer: Self-pay | Admitting: Internal Medicine

## 2022-05-01 DIAGNOSIS — E1165 Type 2 diabetes mellitus with hyperglycemia: Secondary | ICD-10-CM

## 2022-05-01 DIAGNOSIS — Z0001 Encounter for general adult medical examination with abnormal findings: Secondary | ICD-10-CM

## 2022-05-01 DIAGNOSIS — E119 Type 2 diabetes mellitus without complications: Secondary | ICD-10-CM

## 2022-05-01 DIAGNOSIS — N6011 Diffuse cystic mastopathy of right breast: Secondary | ICD-10-CM

## 2022-05-01 DIAGNOSIS — N898 Other specified noninflammatory disorders of vagina: Secondary | ICD-10-CM

## 2022-05-01 MED ORDER — METFORMIN HCL 500 MG PO TABS: 500.0000 mg | ORAL_TABLET | Freq: Two times a day (BID) | ORAL | 0 refills | Status: DC

## 2022-05-01 MED ORDER — FLUCONAZOLE 150 MG PO TABS: ORAL_TABLET | ORAL | 0 refills | Status: DC

## 2022-05-01 NOTE — Progress Notes (Signed)
Kristi Compton   Internal MEDICINE  Office Visit Note  Patient Name: Kristi Compton  [id number]  [id number]  Date of Service: 05/01/2022  Chief Complaint  Patient presents with   Follow-up    HPI  This is virtual visit Vaginal itching and discharge, still gets her cycle  Breast cyst Needs refills on metformin, has not had labs done in one year C/o hemorrhoids   Current Medication: Outpatient Encounter Medications as of 05/01/2022  Medication Sig   fluconazole (DIFLUCAN) 150 MG tablet Take one tab a day x 3 days and then Take one tab po q week for fungal infection   atorvastatin (LIPITOR) 10 MG tablet Take 1 tablet (10 mg total) by mouth daily.   Ferrous Fumarate-Folic Acid 324-1 MG TABS Take one tab po qd for iron   lisinopril (ZESTRIL) 5 MG tablet Take 1 tablet (5 mg total) by mouth daily.   meloxicam (MOBIC) 15 MG tablet Take 1 tablet (15 mg total) by mouth daily.   metFORMIN (GLUCOPHAGE) 500 MG tablet Take 1 tablet (500 mg total) by mouth 2 (two) times daily with a meal.   [DISCONTINUED] metFORMIN (GLUCOPHAGE) 500 MG tablet Take 1 tablet (500 mg total) by mouth 2 (two) times daily with a meal.   Facility-Administered Encounter Medications as of 05/01/2022  Medication   tiZANidine (ZANAFLEX) tablet 2 mg    Surgical History: Past Surgical History:  Procedure Laterality Date   BREAST CYST ASPIRATION Left 2017    Medical History: Past Medical History:  Diagnosis Date   Anemia    Diabetes mellitus without complication (HCC) 2017   Hypertension     Family History: Family History  Problem Relation Age of Onset   Hypertension Mother    Diabetes Father    Breast cancer Neg Hx     Social History   Socioeconomic History   Marital status: Married    Spouse name: Kristi Compton   Number of children: 4   Years of education: Not on file   Highest education level: GED or equivalent  Occupational History   Not on file  Tobacco Use   Smoking status: Never   Smokeless  tobacco: Never  Vaping Use   Vaping Use: Never used  Substance and Sexual Activity   Alcohol use: Never   Drug use: Never   Sexual activity: Not on file  Other Topics Concern   Not on file  Social History Narrative   Not on file   Social Determinants of Health   Financial Resource Strain: Not on file  Food Insecurity: Not on file  Transportation Needs: Not on file  Physical Activity: Not on file  Stress: Not on file  Social Connections: Not on file  Intimate Partner Violence: Not on file      Review of Systems  Constitutional:  Negative for chills, diaphoresis and fatigue.  HENT:  Negative for ear pain, postnasal drip and sinus pressure.   Eyes:  Negative for photophobia, discharge, redness, itching and visual disturbance.  Respiratory:  Negative for cough, shortness of breath and wheezing.   Cardiovascular:  Negative for chest pain, palpitations and leg swelling.  Gastrointestinal:  Positive for anal bleeding. Negative for abdominal pain, constipation, diarrhea, nausea and vomiting.  Genitourinary:  Positive for vaginal discharge. Negative for dysuria and flank pain.       Itching  Musculoskeletal:  Negative for arthralgias, back pain, gait problem and neck pain.  Skin:  Negative for color change.  Allergic/Immunologic: Negative for environmental allergies and  food allergies.  Neurological:  Negative for dizziness and headaches.  Hematological:  Does not bruise/bleed easily.  Psychiatric/Behavioral:  Negative for agitation, behavioral problems (depression) and hallucinations.     Vital Signs: There were no vitals taken for this visit.   Physical Exam  No exam    Assessment/Plan: 1. Abnormal physical evaluation All appropriate diagnostics are ordered  - Ambulatory referral to Obstetrics / Gynecology - MM 3D SCREEN BREAST BILATERAL; Future - CBC with Differential/Platelet; Future - Lipid Panel With LDL/HDL Ratio; Future - TSH; Future - T4, free; Future -  Comprehensive metabolic panel - Urine Microalbumin w/creat. ratio - Microalbumin, urine   2. Poorly controlled diabetes mellitus (Kristi Compton) Refilled metformin for her  - Urine Microalbumin w/creat. ratio - Microalbumin, urine - fluconazole (DIFLUCAN) 150 MG tablet; Take one tab a day x 3 days and then Take one tab po q week for fungal infection  Dispense: 10 tablet; Refill: 0 - metFORMIN (GLUCOPHAGE) 500 MG tablet; Take 1 tablet (500 mg total) by mouth 2 (two) times daily with a meal.  Dispense: 180 tablet; Refill: 0   3. Vaginal itching Ongoing problem, will need pap and vaginal exam  - Ambulatory referral to Obstetrics / Gynecology - fluconazole (DIFLUCAN) 150 MG tablet; Take one tab a day x 3 days and then Take one tab po q week for fungal infection  Dispense: 10 tablet; Refill: 0  4. Fibrocystic breast changes, bilateral Previous h/o, will need f/u u/s   - Ambulatory referral to Obstetrics / Gynecology - MM 3D SCREEN BREAST BILATERAL; Future Might need y   General Counseling: Kristi Compton verbalizes understanding of the findings of todays visit and agrees with plan of treatment. I have discussed any further diagnostic evaluation that may be needed or ordered today. We also reviewed her medications today. she has been encouraged to call the office with any questions or concerns that should arise related to todays visit.    Orders Placed This Encounter  Procedures   MM 3D SCREEN BREAST BILATERAL   CBC with Differential/Platelet   Lipid Panel With LDL/HDL Ratio   TSH   T4, free   Comprehensive metabolic panel   Urine Microalbumin w/creat. ratio   Microalbumin, urine   Ambulatory referral to Obstetrics / Gynecology    Meds ordered this encounter  Medications   fluconazole (DIFLUCAN) 150 MG tablet    Sig: Take one tab a day x 3 days and then Take one tab po q week for fungal infection    Dispense:  10 tablet    Refill:  0   metFORMIN (GLUCOPHAGE) 500 MG tablet    Sig: Take 1  tablet (500 mg total) by mouth 2 (two) times daily with a meal.    Dispense:  180 tablet    Refill:  0    Total time spent:15 Minutes Time spent includes review of chart, medications, test results, and follow up plan with the patient.   [place] Controlled Substance Database was reviewed by me.   Dr Lavera Guise Internal medicine

## 2022-05-07 ENCOUNTER — Other Ambulatory Visit: Payer: Self-pay | Admitting: Internal Medicine

## 2022-05-10 LAB — LIPID PANEL W/O CHOL/HDL RATIO
Cholesterol, Total: 195 mg/dL (ref 100–199)
HDL: 49 mg/dL (ref >39)
LDL Chol Calc (NIH): 109 mg/dL — ABNORMAL HIGH (ref 0–99)
Triglycerides: 215 mg/dL — ABNORMAL HIGH (ref 0–149)
VLDL Cholesterol Cal: 37 mg/dL (ref 5–40)

## 2022-05-10 LAB — URINALYSIS, ROUTINE W REFLEX MICROSCOPIC
Bilirubin, UA: NEGATIVE
Ketones, UA: NEGATIVE
Nitrite, UA: NEGATIVE
Protein,UA: NEGATIVE
RBC, UA: NEGATIVE
Specific Gravity, UA: 1.011 (ref 1.005–1.030)
Urobilinogen, Ur: 0.2 mg/dL (ref 0.2–1.0)
pH, UA: 6 (ref 5.0–7.5)

## 2022-05-10 LAB — CBC WITH DIFFERENTIAL/PLATELET
Basophils Absolute: 0.1 10*3/uL (ref 0.0–0.2)
Basos: 1 %
EOS (ABSOLUTE): 0.3 10*3/uL (ref 0.0–0.4)
Eos: 3 %
Hematocrit: 38.8 % (ref 34.0–46.6)
Hemoglobin: 10.3 g/dL — ABNORMAL LOW (ref 11.1–15.9)
Immature Grans (Abs): 0 10*3/uL (ref 0.0–0.1)
Immature Granulocytes: 0 %
Lymphocytes Absolute: 3.8 10*3/uL — ABNORMAL HIGH (ref 0.7–3.1)
Lymphs: 43 %
MCH: 17.5 pg — ABNORMAL LOW (ref 26.6–33.0)
MCHC: 26.5 g/dL — ABNORMAL LOW (ref 31.5–35.7)
MCV: 66 fL — ABNORMAL LOW (ref 79–97)
Monocytes Absolute: 0.6 10*3/uL (ref 0.1–0.9)
Monocytes: 7 %
Neutrophils Absolute: 4.1 10*3/uL (ref 1.4–7.0)
Neutrophils: 46 %
Platelets: 344 10*3/uL (ref 150–450)
RBC: 5.89 x10E6/uL — ABNORMAL HIGH (ref 3.77–5.28)
RDW: 18.4 % — ABNORMAL HIGH (ref 11.7–15.4)
WBC: 8.9 10*3/uL (ref 3.4–10.8)

## 2022-05-10 LAB — COMPREHENSIVE METABOLIC PANEL
ALT: 15 IU/L (ref 0–32)
AST: 21 IU/L (ref 0–40)
Albumin/Globulin Ratio: 1.4 (ref 1.2–2.2)
Albumin: 4.4 g/dL (ref 3.9–4.9)
Alkaline Phosphatase: 72 IU/L (ref 44–121)
BUN/Creatinine Ratio: 15 (ref 9–23)
BUN: 8 mg/dL (ref 6–24)
Bilirubin Total: 0.6 mg/dL (ref 0.0–1.2)
CO2: 21 mmol/L (ref 20–29)
Calcium: 9.5 mg/dL (ref 8.7–10.2)
Chloride: 96 mmol/L (ref 96–106)
Creatinine, Ser: 0.53 mg/dL — ABNORMAL LOW (ref 0.57–1.00)
Globulin, Total: 3.1 g/dL (ref 1.5–4.5)
Glucose: 234 mg/dL — ABNORMAL HIGH (ref 70–99)
Potassium: 5.5 mmol/L — ABNORMAL HIGH (ref 3.5–5.2)
Sodium: 132 mmol/L — ABNORMAL LOW (ref 134–144)
Total Protein: 7.5 g/dL (ref 6.0–8.5)
eGFR: 115 mL/min/{1.73_m2} (ref >59)

## 2022-05-10 LAB — URINE CULTURE

## 2022-05-10 LAB — MICROSCOPIC EXAMINATION
Bacteria, UA: NONE SEEN
Casts: NONE SEEN /lpf
RBC, Urine: NONE SEEN /hpf (ref 0–2)

## 2022-05-10 LAB — T4, FREE: Free T4: 1.19 ng/dL (ref 0.82–1.77)

## 2022-05-10 LAB — TSH: TSH: 2.56 u[IU]/mL (ref 0.450–4.500)

## 2022-05-21 ENCOUNTER — Other Ambulatory Visit: Payer: Self-pay

## 2022-05-21 DIAGNOSIS — Z1231 Encounter for screening mammogram for malignant neoplasm of breast: Secondary | ICD-10-CM

## 2022-05-29 ENCOUNTER — Ambulatory Visit: Payer: Self-pay | Admitting: Internal Medicine

## 2022-06-12 ENCOUNTER — Ambulatory Visit: Payer: Self-pay | Admitting: Internal Medicine

## 2022-06-15 ENCOUNTER — Ambulatory Visit: Payer: Self-pay

## 2022-06-21 ENCOUNTER — Other Ambulatory Visit: Payer: Self-pay

## 2022-06-21 ENCOUNTER — Ambulatory Visit: Payer: Self-pay | Attending: Hematology and Oncology | Admitting: Hematology and Oncology

## 2022-06-21 ENCOUNTER — Ambulatory Visit
Admission: RE | Admit: 2022-06-21 | Discharge: 2022-06-21 | Disposition: A | Discharge status: Home / Self Care | Payer: Self-pay | Source: Ambulatory Visit | Attending: Obstetrics and Gynecology | Admitting: Obstetrics and Gynecology

## 2022-06-21 VITALS — BP 167/101

## 2022-06-21 DIAGNOSIS — Z01419 Encounter for gynecological examination (general) (routine) without abnormal findings: Secondary | ICD-10-CM

## 2022-06-21 DIAGNOSIS — Z1231 Encounter for screening mammogram for malignant neoplasm of breast: Secondary | ICD-10-CM | POA: Insufficient documentation

## 2022-06-21 NOTE — Patient Instructions (Addendum)
Taught [name] how to perform BSE and gave educational materials to take home. Patient did need a Pap smear today due to last Pap smear was in 0/03/2018 per patient and will be due in June 2024.  Let her know [facility] will cover Pap smears every 5 years unless has a history of abnormal Pap smears. [facility] Appointment scheduled for 06/21/22. Patient aware of appointment and will be there. Let patient know will follow up with her within the next couple weeks with results. Kristi Compton verbalized understanding.  Melodye Ped, NP 9:21 AM

## 2022-06-21 NOTE — Progress Notes (Signed)
Ms. Kristi Compton is a 47 y.o. G4P0 female who presents to [facility] clinic today with no complaints.    Pap Smear: Pap smear completed today. Last Pap smear was 2019 at [facility] clinic and was normal. Per patient has no history of an abnormal Pap smear. Last Pap smear result is available in Epic.   Physical exam: Breasts Breasts symmetrical. No skin abnormalities bilateral breasts. No nipple retraction bilateral breasts. No nipple discharge bilateral breasts. No lymphadenopathy. No lumps palpated bilateral breasts.   MS DIGITAL DIAG TOMO BILAT  Result Date: 10/13/2017 CLINICAL DATA:  47 year old patient recently seen in the [place] clinic. She has a 3 cm mass in the 9 o'clock position the right breast that is tender. She has a known history of breast cysts. EXAM: DIGITAL DIAGNOSTIC BILATERAL MAMMOGRAM WITH CAD AND TOMO ULTRASOUND BILATERAL BREAST COMPARISON:  Previous exam(s). ACR Breast Density Category c: The breast tissue is heterogeneously dense, which may obscure small masses. FINDINGS: There is a circumscribed oval mass in the 9 o'clock region of the right breast. Circumscribed oval mass is seen in the medial left breast in the 8-9 o'clock region. No irregular mass, architectural distortion, or suspicious microcalcifications. Mammographic images were processed with CAD. On physical exam, I palpate a mobile mass in the 9:30 position of the right breast 12 cm from the nipple measuring approximately 3 cm. I do not palpate a definite mass in the medial left breast in the region of the mass on the mammogram. While in the ultrasound room, the patient asked me to evaluate the outer left breast, which causes her intermittent tenderness. Targeted ultrasound is performed, showing a simple oval 3.1 x 3.3 x 1.9 cm cyst at 9:30 position 12 cm from the nipple in right breast. This accounts for the palpable mass. In the left breast at 8:30 position 8 cm from the nipple is a 2.3 x 2.5 x 1.2 cm oval the mass seen in the  mammogram. Ultrasound of the outer left breast in the 3 o'clock region where the patient has intermittent tenderness shows a few smaller scattered cysts. No suspicious findings. IMPRESSION: Bilateral cysts. No evidence of malignancy. The palpable mass at 9 o'clock position in the right breast is a 3.3 cm simple cyst. RECOMMENDATION: Screening mammogram in one year.(Code:SM-B-01Y) I have discussed the findings and recommendations with the patient. Results were also provided in writing at the conclusion of the visit. If applicable, a reminder letter will be sent to the patient regarding the next appointment. BI-RADS CATEGORY  2: Benign. Electronically Signed   By: Curlene Dolphin M.D.   On: 10/13/2017 14:20        Pelvic/Bimanual Ext Genitalia No lesions, no swelling and no discharge observed on external genitalia.        Vagina Vagina pink and normal texture. No lesions or discharge observed in vagina.        Cervix Cervix is present. Cervix pink and of normal texture. No discharge observed.    Uterus Uterus is present and palpable. Uterus in normal position and normal size.        Adnexae Bilateral ovaries present and palpable. No tenderness on palpation.         Rectovaginal No rectal exam completed today since patient had no rectal complaints. No skin abnormalities observed on exam.     Smoking History: Patient has never smoked and was not referred to quit line.    Patient Navigation: Patient education provided. Access to services provided for patient through [facility]  program. No interpreter provided. No transportation provided   Colorectal Cancer Screening: Per patient has never had colonoscopy completed No complaints today. Declined FIT test   Breast and Cervical Cancer Risk Assessment: Patient does not have family history of breast cancer, known genetic mutations, or radiation treatment to the chest before age 85. Patient does not have history of cervical dysplasia, immunocompromised,  or DES exposure in-utero.  Risk Assessment   No risk assessment data for the current encounter  Risk Scores       10/05/2017   Last edited by: Theodore Demark, RN   5-year risk: 0.6 %   Lifetime risk: 8.8 %            A: [facility]CP exam with pap smear No complaints with benign exams.   P: [facility]. Appointment scheduled 06/21/22.  Melodye Ped, NP 06/21/2022 9:36 AM

## 2022-06-22 ENCOUNTER — Other Ambulatory Visit: Payer: Self-pay | Admitting: Internal Medicine

## 2022-06-23 LAB — HGB A1C W/O EAG: Hgb A1c MFr Bld: 10.2 % — ABNORMAL HIGH (ref 4.8–5.6)

## 2022-06-24 ENCOUNTER — Telehealth: Payer: Self-pay

## 2022-06-24 LAB — CYTOLOGY - PAP
Comment: NEGATIVE
Diagnosis: NEGATIVE
High risk HPV: NEGATIVE

## 2022-06-24 NOTE — Progress Notes (Signed)
Uncontrolled dm

## 2022-06-24 NOTE — Telephone Encounter (Signed)
Patient informed negative Pap/HPV results, next pap due in 3-5 years, verbalized understanding.

## 2022-06-26 ENCOUNTER — Ambulatory Visit: Payer: Self-pay | Admitting: Internal Medicine

## 2022-06-26 ENCOUNTER — Encounter: Payer: Self-pay | Admitting: Internal Medicine

## 2022-06-26 VITALS — BP 174/108 | HR 88 | Temp 98.5°F | Ht 65.35 in | Wt 166.0 lb

## 2022-06-26 DIAGNOSIS — I10 Essential (primary) hypertension: Secondary | ICD-10-CM

## 2022-06-26 DIAGNOSIS — E1165 Type 2 diabetes mellitus with hyperglycemia: Secondary | ICD-10-CM

## 2022-06-26 NOTE — Progress Notes (Signed)
Internal MEDICINE  Office Visit Note  Patient Name: Kristi Compton  [id number]  [id number]  Date of Service: 06/26/2022  Chief Complaint  Patient presents with   Follow-up    HPI  Pt is here for routine follow up.  DM- poor control- AIC 10.2-  med complaince issue- take only metformin  was not taking - started 1 tab last week Was not taking any meds Last month seen - started  med 1 week BP high   Current Medication: Outpatient Encounter Medications as of 06/26/2022  Medication Sig   metFORMIN (GLUCOPHAGE) 500 MG tablet Take 1 tablet (500 mg total) by mouth 2 (two) times daily with a meal.   [DISCONTINUED] atorvastatin (LIPITOR) 10 MG tablet Take 1 tablet (10 mg total) by mouth daily.   [DISCONTINUED] Ferrous Fumarate-Folic Acid 99991111 MG TABS Take one tab po qd for iron   [DISCONTINUED] fluconazole (DIFLUCAN) 150 MG tablet Take one tab a day x 3 days and then Take one tab po q week for fungal infection   [DISCONTINUED] lisinopril (ZESTRIL) 5 MG tablet Take 1 tablet (5 mg total) by mouth daily.   [DISCONTINUED] meloxicam (MOBIC) 15 MG tablet Take 1 tablet (15 mg total) by mouth daily.   Facility-Administered Encounter Medications as of 06/26/2022  Medication   tiZANidine (ZANAFLEX) tablet 2 mg    Surgical History: Past Surgical History:  Procedure Laterality Date   BREAST CYST ASPIRATION Left 2017    Medical History: Past Medical History:  Diagnosis Date   Anemia    Diabetes mellitus without complication ([place]) 0000000   Hypertension     Family History: Family History  Problem Relation Age of Onset   Hypertension Mother    Diabetes Father    Breast cancer Neg Hx     Social History   Socioeconomic History   Marital status: Married    Spouse name: Kristi Compton   Number of children: 4   Years of education: Not on file   Highest education level: GED or equivalent  Occupational History   Not on file  Tobacco Use   Smoking status: Never   Smokeless tobacco:  Never  Vaping Use   Vaping Use: Never used  Substance and Sexual Activity   Alcohol use: Never   Drug use: Never   Sexual activity: Not Currently  Other Topics Concern   Not on file  Social History Narrative   Not on file   Social Determinants of Health   Financial Resource Strain: Not on file  Food Insecurity: No Food Insecurity (06/21/2022)   Hunger Vital Sign    Worried About Running Out of Food in the Last Year: Never true    Ran Out of Food in the Last Year: Never true  Transportation Needs: No Transportation Needs (06/21/2022)   PRAPARE - Hydrologist (Medical): No    Lack of Transportation (Non-Medical): No  Physical Activity: Not on file  Stress: Not on file  Social Connections: Not on file  Intimate Partner Violence: Not on file      Review of Systems  Vital Signs: BP (!) 174/108   Pulse 88   Temp 98.5 F (36.9 C)   Ht 5' 5.35" (1.66 m)   Wt 166 lb (75.3 kg)   LMP 05/09/2022 (Exact Date)   SpO2 100%   BMI 27.33 kg/m    Physical Exam     Assessment/Plan: DM-  AIC high- just started Metformin-  Increase to BID 500 mh  HTN- BP high - 168/98-  start lisinopril 20 mg daily Repeat f/u in 3 month after lab  General Counseling: Kristi Compton verbalizes understanding of the findings of todays visit and agrees with plan of treatment. I have discussed any further diagnostic evaluation that may be needed or ordered today. We also reviewed her medications today. she has been encouraged to call the office with any questions or concerns that should arise related to todays visit.    No orders of the defined types were placed in this encounter.   No orders of the defined types were placed in this encounter.       Wenda Low

## 2022-09-25 ENCOUNTER — Ambulatory Visit: Payer: Self-pay | Admitting: Internal Medicine

## 2022-10-09 ENCOUNTER — Ambulatory Visit: Payer: Self-pay | Admitting: Internal Medicine

## 2023-03-12 ENCOUNTER — Other Ambulatory Visit: Payer: Self-pay

## 2023-03-12 ENCOUNTER — Ambulatory Visit: Payer: Self-pay | Admitting: Internal Medicine

## 2023-03-12 ENCOUNTER — Encounter: Payer: Self-pay | Admitting: Internal Medicine

## 2023-03-12 VITALS — BP 140/120 | HR 88 | Temp 98.2°F | Ht 66.54 in | Wt 163.2 lb

## 2023-03-12 DIAGNOSIS — D509 Iron deficiency anemia, unspecified: Secondary | ICD-10-CM

## 2023-03-12 DIAGNOSIS — N920 Excessive and frequent menstruation with regular cycle: Secondary | ICD-10-CM

## 2023-03-12 DIAGNOSIS — E1165 Type 2 diabetes mellitus with hyperglycemia: Secondary | ICD-10-CM

## 2023-03-12 DIAGNOSIS — E782 Mixed hyperlipidemia: Secondary | ICD-10-CM

## 2023-03-12 DIAGNOSIS — I10 Essential (primary) hypertension: Secondary | ICD-10-CM

## 2023-03-12 MED ORDER — FLUTICASONE PROPIONATE 50 MCG/ACT NA SUSP: 2.0000 | Freq: Every day | NASAL | 6 refills | Status: DC

## 2023-03-12 MED ORDER — IPRATROPIUM BROMIDE 0.03 % NA SOLN: 2.0000 | Freq: Two times a day (BID) | NASAL | 12 refills | Status: DC

## 2023-03-12 MED ORDER — IPRATROPIUM BROMIDE 0.03 % NA SOLN: 2.0000 | Freq: Two times a day (BID) | NASAL | 12 refills | Status: AC

## 2023-03-12 MED ORDER — FLUTICASONE PROPIONATE 50 MCG/ACT NA SUSP: 2.0000 | Freq: Every day | NASAL | 6 refills | Status: AC

## 2023-03-12 MED ORDER — GLIMEPIRIDE 1 MG PO TABS: ORAL_TABLET | ORAL | 1 refills | Status: AC

## 2023-03-12 NOTE — Progress Notes (Signed)
[facility] [address] [place], [place] [address]  Internal MEDICINE  Office Visit Note  Patient Name: Kristi Compton  [id number]  [id number]  Date of Service: 04/08/2023  Chief Complaint  Patient presents with   Follow-up    HPI Pt is seen for multiple complaints, she is no adherent to therapy Has uncontrolled dm and htn Weight loss Abnormal and heavy cycle   Post nasal drip and allergies     Current Medication: Outpatient Encounter Medications as of 03/12/2023  Medication Sig   glimepiride (AMARYL) 1 MG tablet Take one tab po with Lunch for dm   LISINOPRIL PO Take by mouth daily.   [DISCONTINUED] fluticasone (FLONASE) 50 MCG/ACT nasal spray Place 2 sprays into both nostrils daily.   [DISCONTINUED] ipratropium (ATROVENT) 0.03 % nasal spray Place 2 sprays into both nostrils every 12 (twelve) hours.   fluticasone (FLONASE) 50 MCG/ACT nasal spray Place 2 sprays into both nostrils daily.   ipratropium (ATROVENT) 0.03 % nasal spray Place 2 sprays into both nostrils every 12 (twelve) hours.   metFORMIN (GLUCOPHAGE) 500 MG tablet Take 1 tablet (500 mg total) by mouth 2 (two) times daily with a meal.   Facility-Administered Encounter Medications as of 03/12/2023  Medication   tiZANidine (ZANAFLEX) tablet 2 mg    Surgical History: Past Surgical History:  Procedure Laterality Date   BREAST CYST ASPIRATION Left 2017    Medical History: Past Medical History:  Diagnosis Date   Anemia    Diabetes mellitus without complication (HCC) 2017   Hypertension     Family History: Family History  Problem Relation Age of Onset   Hypertension Mother    Diabetes Father    Breast cancer Neg Hx     Social History   Socioeconomic History   Marital status: Married    Spouse name: Madden Pelzel   Number of children: 4   Years of education: Not on file   Highest education level: GED or equivalent  Occupational History   Not on file  Tobacco Use   Smoking  status: Never   Smokeless tobacco: Never  Vaping Use   Vaping status: Never Used  Substance and Sexual Activity   Alcohol use: Never   Drug use: Never   Sexual activity: Not Currently  Other Topics Concern   Not on file  Social History Narrative   Not on file   Social Drivers of Health   Financial Resource Strain: Not on file  Food Insecurity: No Food Insecurity (06/21/2022)   Hunger Vital Sign    Worried About Running Out of Food in the Last Year: Never true    Ran Out of Food in the Last Year: Never true  Transportation Needs: No Transportation Needs (06/21/2022)   PRAPARE - Administrator, Civil Service (Medical): No    Lack of Transportation (Non-Medical): No  Physical Activity: Not on file  Stress: Not on file  Social Connections: Not on file  Intimate Partner Violence: Not on file      Review of Systems  Constitutional:  Positive for fatigue.       Hair loss  Genitourinary:  Positive for vaginal bleeding.    Vital Signs: BP (!) 140/120 (BP Location: Right Arm, Patient Position: Sitting, Cuff Size: Normal)   Pulse 88   Temp 98.2 F (36.8 C) (Oral)   Ht 5' 6.54" (1.69 m)   Wt 163 lb 3.2 oz (74 kg)   SpO2 97%   BMI 25.92 kg/m  Physical Exam Constitutional:      Appearance: Normal appearance.  HENT:     Head: Normocephalic and atraumatic.     Nose: Nose normal.     Mouth/Throat:     Mouth: Mucous membranes are moist.     Pharynx: No posterior oropharyngeal erythema.  Eyes:     Extraocular Movements: Extraocular movements intact.     Pupils: Pupils are equal, round, and reactive to light.  Cardiovascular:     Pulses: Normal pulses.     Heart sounds: Normal heart sounds.  Pulmonary:     Effort: Pulmonary effort is normal.     Breath sounds: Normal breath sounds.  Neurological:     General: No focal deficit present.     Mental Status: She is alert.  Psychiatric:        Mood and Affect: Mood normal.        Behavior: Behavior normal.         Assessment/Plan: 1. Inadequately controlled diabetes mellitus (HCC) (Primary) Pt is suppose to be on metformin at home, will add low dose Amaryl   2. Primary hypertension Pt is supposed to be on Lisinopril - US Abdomen Complete; Future  3. Mixed hyperlipidemia Update labs   4. Iron deficiency anemia, unspecified iron deficiency anemia type Repeat labs  - CBC with Differential/Platelet - Fe+TIBC+Fer - TSH + free T4 - Lipid Panel With LDL/HDL Ratio - Comprehensive metabolic panel - FSH/LH - Estrogens, total - glimepiride (AMARYL) 1 MG tablet; Take one tab po with Lunch for dm  Dispense: 90 tablet; Refill: 1 - ipratropium (ATROVENT) 0.03 % nasal spray; Place 2 sprays into both nostrils every 12 (twelve) hours.  Dispense: 30 mL; Refill: 12 - fluticasone (FLONASE) 50 MCG/ACT nasal spray; Place 2 sprays into both nostrils daily.  Dispense: 16 g; Refill: 6 - US Abdomen Complete; Future  5. Menorrhagia with regular cycle Pelvic u/s , FSH/LH  - US Abdomen Complete; Future   General Counseling: Ginger verbalizes understanding of the findings of todays visit and agrees with plan of treatment. I have discussed any further diagnostic evaluation that may be needed or ordered today. We also reviewed her medications today. she has been encouraged to call the office with any questions or concerns that should arise related to todays visit.    Orders Placed This Encounter  Procedures   US Abdomen Complete   CBC with Differential/Platelet   Fe+TIBC+Fer   TSH + free T4   Lipid Panel With LDL/HDL Ratio   Comprehensive metabolic panel   FSH/LH   Estrogens, total    Meds ordered this encounter  Medications   glimepiride (AMARYL) 1 MG tablet    Sig: Take one tab po with Lunch for dm    Dispense:  90 tablet    Refill:  1   DISCONTD: ipratropium (ATROVENT) 0.03 % nasal spray    Sig: Place 2 sprays into both nostrils every 12 (twelve) hours.    Dispense:  30 mL    Refill:  12    DISCONTD: fluticasone (FLONASE) 50 MCG/ACT nasal spray    Sig: Place 2 sprays into both nostrils daily.    Dispense:  16 g    Refill:  6   ipratropium (ATROVENT) 0.03 % nasal spray    Sig: Place 2 sprays into both nostrils every 12 (twelve) hours.    Dispense:  30 mL    Refill:  12   fluticasone (FLONASE) 50 MCG/ACT nasal spray    Sig: Place 2  sprays into both nostrils daily.    Dispense:  16 g    Refill:  6    Total time spent:45 Minutes Time spent includes review of chart, medications, test results, and follow up plan with the patient.   [place] Controlled Substance Database was reviewed by me.   Dr Lyndon Code Internal medicine

## 2023-03-18 ENCOUNTER — Other Ambulatory Visit: Payer: Self-pay | Admitting: Internal Medicine

## 2023-03-19 LAB — CBC WITH DIFFERENTIAL/PLATELET
Basophils Absolute: 0.1 10*3/uL (ref 0.0–0.2)
Basos: 1 %
EOS (ABSOLUTE): 0.2 10*3/uL (ref 0.0–0.4)
Eos: 3 %
Hematocrit: 37.4 % (ref 34.0–46.6)
Hemoglobin: 10.4 g/dL — ABNORMAL LOW (ref 11.1–15.9)
Immature Grans (Abs): 0 10*3/uL (ref 0.0–0.1)
Immature Granulocytes: 0 %
Lymphocytes Absolute: 4.2 10*3/uL — ABNORMAL HIGH (ref 0.7–3.1)
Lymphs: 46 %
MCH: 19.4 pg — ABNORMAL LOW (ref 26.6–33.0)
MCHC: 27.8 g/dL — ABNORMAL LOW (ref 31.5–35.7)
MCV: 70 fL — ABNORMAL LOW (ref 79–97)
Monocytes Absolute: 0.5 10*3/uL (ref 0.1–0.9)
Monocytes: 6 %
Neutrophils Absolute: 4 10*3/uL (ref 1.4–7.0)
Neutrophils: 44 %
Platelets: 298 10*3/uL (ref 150–450)
RBC: 5.37 x10E6/uL — ABNORMAL HIGH (ref 3.77–5.28)
RDW: 21.3 % — ABNORMAL HIGH (ref 11.7–15.4)
WBC: 9 10*3/uL (ref 3.4–10.8)

## 2023-03-19 LAB — LIPID PANEL WITH LDL/HDL RATIO
Cholesterol, Total: 178 mg/dL (ref 100–199)
HDL: 38 mg/dL — ABNORMAL LOW (ref >39)
LDL Chol Calc (NIH): 101 mg/dL — ABNORMAL HIGH (ref 0–99)
LDL/HDL Ratio: 2.7 ratio (ref 0.0–3.2)
Triglycerides: 228 mg/dL — ABNORMAL HIGH (ref 0–149)
VLDL Cholesterol Cal: 39 mg/dL (ref 5–40)

## 2023-03-19 LAB — COMPREHENSIVE METABOLIC PANEL
ALT: 11 [IU]/L (ref 0–32)
AST: 18 [IU]/L (ref 0–40)
Albumin: 4.1 g/dL (ref 3.9–4.9)
Alkaline Phosphatase: 65 [IU]/L (ref 44–121)
BUN/Creatinine Ratio: 17 (ref 9–23)
BUN: 9 mg/dL (ref 6–24)
Bilirubin Total: 0.5 mg/dL (ref 0.0–1.2)
CO2: 21 mmol/L (ref 20–29)
Calcium: 9.3 mg/dL (ref 8.7–10.2)
Chloride: 100 mmol/L (ref 96–106)
Creatinine, Ser: 0.54 mg/dL — ABNORMAL LOW (ref 0.57–1.00)
Globulin, Total: 3 g/dL (ref 1.5–4.5)
Glucose: 193 mg/dL — ABNORMAL HIGH (ref 70–99)
Potassium: 5.1 mmol/L (ref 3.5–5.2)
Sodium: 135 mmol/L (ref 134–144)
Total Protein: 7.1 g/dL (ref 6.0–8.5)
eGFR: 113 mL/min/{1.73_m2} (ref >59)

## 2023-03-19 LAB — FSH/LH
FSH: 2 m[IU]/mL
LH: 3.6 m[IU]/mL

## 2023-03-19 LAB — IRON AND TIBC
Iron Saturation: 4 % — CL (ref 15–55)
Iron: 18 ug/dL — ABNORMAL LOW (ref 27–159)
Total Iron Binding Capacity: 409 ug/dL (ref 250–450)
UIBC: 391 ug/dL (ref 131–425)

## 2023-03-19 LAB — B12 AND FOLATE PANEL
Folate: 13 ng/mL (ref >3.0)
Vitamin B-12: 584 pg/mL (ref 232–1245)

## 2023-03-19 LAB — TSH: TSH: 4.28 u[IU]/mL (ref 0.450–4.500)

## 2023-03-19 LAB — ESTRADIOL: Estradiol: 365 pg/mL

## 2023-03-19 LAB — FERRITIN: Ferritin: 9 ng/mL — ABNORMAL LOW (ref 15–150)

## 2023-03-19 LAB — T4, FREE: Free T4: 1.12 ng/dL (ref 0.82–1.77)

## 2023-03-25 LAB — SPECIMEN STATUS REPORT

## 2023-03-28 LAB — SPECIMEN STATUS REPORT

## 2023-03-28 LAB — HGB A1C W/O EAG: Hgb A1c MFr Bld: 8.9 % — ABNORMAL HIGH (ref 4.8–5.6)

## 2023-04-07 ENCOUNTER — Other Ambulatory Visit: Payer: Self-pay | Admitting: Internal Medicine

## 2023-04-07 ENCOUNTER — Ambulatory Visit
Admission: RE | Admit: 2023-04-07 | Discharge: 2023-04-07 | Disposition: A | Discharge status: Home / Self Care | Payer: Self-pay | Source: Ambulatory Visit | Attending: Internal Medicine | Admitting: Internal Medicine

## 2023-04-07 DIAGNOSIS — N920 Excessive and frequent menstruation with regular cycle: Secondary | ICD-10-CM

## 2023-04-07 DIAGNOSIS — D509 Iron deficiency anemia, unspecified: Secondary | ICD-10-CM

## 2023-04-07 DIAGNOSIS — E1165 Type 2 diabetes mellitus with hyperglycemia: Secondary | ICD-10-CM

## 2023-04-07 DIAGNOSIS — E782 Mixed hyperlipidemia: Secondary | ICD-10-CM

## 2023-04-07 DIAGNOSIS — I10 Essential (primary) hypertension: Secondary | ICD-10-CM

## 2023-04-09 ENCOUNTER — Telehealth: Payer: Self-pay | Admitting: Internal Medicine

## 2023-04-09 DIAGNOSIS — E1165 Type 2 diabetes mellitus with hyperglycemia: Secondary | ICD-10-CM

## 2023-04-09 DIAGNOSIS — D500 Iron deficiency anemia secondary to blood loss (chronic): Secondary | ICD-10-CM

## 2023-04-09 DIAGNOSIS — I10 Essential (primary) hypertension: Secondary | ICD-10-CM

## 2023-04-09 DIAGNOSIS — N926 Irregular menstruation, unspecified: Secondary | ICD-10-CM

## 2023-04-09 MED ORDER — LISINOPRIL 10 MG PO TABS: 10.0000 mg | ORAL_TABLET | Freq: Every day | ORAL | Status: AC

## 2023-04-09 MED ORDER — FERROUS SULFATE 325 (65 FE) MG PO TABS: 325.0000 mg | ORAL_TABLET | Freq: Every day | ORAL | Status: AC

## 2023-04-09 NOTE — Progress Notes (Signed)
Internal MEDICINE  Telephone Visit  Patient Name: Kristi Compton  [id number]  [id number]  Date of Service: 04/09/2023  I connected with the patient at 1030 am  by telephone and verified the patients identity using two identifiers.   I discussed the limitations, risks, security and privacy concerns of performing an evaluation and management service by telephone and the availability of in person appointments. I also discussed with the patient that there may be a patient responsible charge related to the service.  The patient expressed understanding and agrees to proceed.    Chief Complaint  Patient presents with   Results   Diabetes    HPI  Uncontrolled diabetes ( metformin 500 mg bid and Amaryl 1 mg every day) , has not been checking her glucose at home, pt is non adherent to therapy  Pelvic u/s is pending Elevated estrogen levels  Ferritin is low due to excessive menstural bleeding, pt is not taking her iron supplement     Current Medication: Outpatient Encounter Medications as of 04/09/2023  Medication Sig   ferrous sulfate 325 (65 FE) MG tablet Take 1 tablet (325 mg total) by mouth daily with breakfast.   lisinopril (ZESTRIL) 10 MG tablet Take 1 tablet (10 mg total) by mouth daily.   fluticasone (FLONASE) 50 MCG/ACT nasal spray Place 2 sprays into both nostrils daily.   glimepiride (AMARYL) 1 MG tablet Take one tab po with Lunch for dm   ipratropium (ATROVENT) 0.03 % nasal spray Place 2 sprays into both nostrils every 12 (twelve) hours.   metFORMIN (GLUCOPHAGE) 500 MG tablet Take 1 tablet (500 mg total) by mouth 2 (two) times daily with a meal.   [DISCONTINUED] LISINOPRIL PO Take by mouth daily.   Facility-Administered Encounter Medications as of 04/09/2023  Medication   tiZANidine (ZANAFLEX) tablet 2 mg    Surgical History: Past Surgical History:  Procedure Laterality Date   BREAST CYST ASPIRATION Left 2017    Medical History: Past Medical History:  Diagnosis  Date   Anemia    Diabetes mellitus without complication (HCC) 2017   Hypertension     Family History: Family History  Problem Relation Age of Onset   Hypertension Mother    Diabetes Father    Breast cancer Neg Hx     Social History   Socioeconomic History   Marital status: Married    Spouse name: Srinidhi Setser   Number of children: 4   Years of education: Not on file   Highest education level: GED or equivalent  Occupational History   Not on file  Tobacco Use   Smoking status: Never   Smokeless tobacco: Never  Vaping Use   Vaping status: Never Used  Substance and Sexual Activity   Alcohol use: Never   Drug use: Never   Sexual activity: Not Currently  Other Topics Concern   Not on file  Social History Narrative   Not on file   Social Drivers of Health   Financial Resource Strain: Not on file  Food Insecurity: No Food Insecurity (06/21/2022)   Hunger Vital Sign    Worried About Running Out of Food in the Last Year: Never true    Ran Out of Food in the Last Year: Never true  Transportation Needs: No Transportation Needs (06/21/2022)   PRAPARE - Administrator, Civil Service (Medical): No    Lack of Transportation (Non-Medical): No  Physical Activity: Not on file  Stress: Not on file  Social Connections: Not on  file  Intimate Partner Violence: Not on file      Review of Systems  Vital Signs: There were no vitals taken for this visit.   Observation/Objective: NAD   Assessment/Plan: 1. Inadequately controlled diabetes mellitus (HCC) (Primary) Encouraged to check her glucose so the therapy can be modified/titrated    2. Iron deficiency anemia due to chronic blood loss Due to # 3, might GI work up, add iron supplement  - ferrous sulfate 325 (65 FE) MG tablet; Take 1 tablet (325 mg total) by mouth daily with breakfast.  3. Irregular menstrual bleeding Pelvic u/s pending, CA125 is ordered, might need to see Gyne   4. Primary  hypertension Refilled  - lisinopril (ZESTRIL) 10 MG tablet; Take 1 tablet (10 mg total) by mouth daily.   General Counseling: Sehaj verbalizes understanding of the findings of today's phone visit and agrees with plan of treatment. I have discussed any further diagnostic evaluation that may be needed or ordered today. We also reviewed her medications today. she has been encouraged to call the office with any questions or concerns that should arise related to todays visit.    No orders of the defined types were placed in this encounter.   Meds ordered this encounter  Medications   ferrous sulfate 325 (65 FE) MG tablet    Sig: Take 1 tablet (325 mg total) by mouth daily with breakfast.   lisinopril (ZESTRIL) 10 MG tablet    Sig: Take 1 tablet (10 mg total) by mouth daily.    Time spent:10 Minutes    Dr Lyndon Code Internal medicine

## 2023-04-14 ENCOUNTER — Other Ambulatory Visit: Payer: Self-pay | Admitting: Internal Medicine

## 2023-04-14 NOTE — Progress Notes (Signed)
Pt has a cyst and a fibroid, needs to see obgyn

## 2023-04-15 LAB — CA 125, SERUM (SERIAL): Cancer Antigen (CA) 125: 18.3 U/mL (ref 0.0–38.1)

## 2023-04-16 MED ORDER — BLOOD GLUCOSE MONITORING SUPPL DEVI
1.0000 | Freq: Three times a day (TID) | 12 refills | Status: AC
Start: 2023-04-16 — End: ?

## 2023-04-16 MED ORDER — LANCET DEVICE MISC: 1.0000 | Freq: Three times a day (TID) | 0 refills | Status: AC

## 2023-04-16 MED ORDER — BLOOD GLUCOSE TEST VI STRP: 1.0000 | ORAL_STRIP | Freq: Three times a day (TID) | 0 refills | Status: AC

## 2023-04-16 MED ORDER — LANCETS MISC. MISC: 1.0000 | Freq: Three times a day (TID) | 0 refills | Status: AC

## 2024-03-31 ENCOUNTER — Ambulatory Visit: Payer: Self-pay | Admitting: Orthopedic Surgery

## 2024-03-31 ENCOUNTER — Ambulatory Visit: Payer: Self-pay | Admitting: Internal Medicine

## 2024-03-31 DIAGNOSIS — E1159 Type 2 diabetes mellitus with other circulatory complications: Secondary | ICD-10-CM

## 2024-03-31 DIAGNOSIS — I10 Essential (primary) hypertension: Secondary | ICD-10-CM

## 2024-03-31 DIAGNOSIS — M170 Bilateral primary osteoarthritis of knee: Secondary | ICD-10-CM

## 2024-03-31 NOTE — Progress Notes (Signed)
 S Bilateral knee pain O: Ambulatory independently      Palpable crepitus       +ve MJLT A: Bil knee djd P: Diclofanac sodium BID 75mg       Xrays standing B knees

## 2024-03-31 NOTE — Progress Notes (Signed)
 Internal MEDICINE  Office Visit Note  Patient Name: Kristi Compton  [id number]  [id number]  Date of Service: 03/31/2024  No chief complaint on file.   HPI  Pt is here for routine follow up.  DM-  taking med- Need lab-  [place]-   not checking BP- high  taking lisinopril   high in office       no CP, no SOB, no HA  Had cold- 1 month- sore thoart, coughing  -  no fever GI issue none   Current Medication: Outpatient Encounter Medications as of 03/31/2024  Medication Sig   Blood Glucose Monitoring Suppl DEVI 1 each by Does not apply route in the morning, at noon, and at bedtime. May substitute to any manufacturer covered by patient's insurance.   ferrous sulfate  325 (65 FE) MG tablet Take 1 tablet (325 mg total) by mouth daily with breakfast.   fluticasone  (FLONASE ) 50 MCG/ACT nasal spray Place 2 sprays into both nostrils daily.   glimepiride  (AMARYL ) 1 MG tablet Take one tab po with Lunch for dm   ipratropium (ATROVENT ) 0.03 % nasal spray Place 2 sprays into both nostrils every 12 (twelve) hours.   lisinopril  (ZESTRIL ) 10 MG tablet Take 1 tablet (10 mg total) by mouth daily.   metFORMIN  (GLUCOPHAGE ) 500 MG tablet Take 1 tablet (500 mg total) by mouth 2 (two) times daily with a meal.   Facility-Administered Encounter Medications as of 03/31/2024  Medication   tiZANidine  (ZANAFLEX ) tablet 2 mg    Surgical History: Past Surgical History:  Procedure Laterality Date   BREAST CYST ASPIRATION Left 2017    Medical History: Past Medical History:  Diagnosis Date   Anemia    Diabetes mellitus without complication (HCC) 2017   Hypertension     Family History: Family History  Problem Relation Age of Onset   Hypertension Mother    Diabetes Father    Breast cancer Neg Hx     Social History   Socioeconomic History   Marital status: Married    Spouse name: Kristi Compton   Number of children: 4   Years of education: Not on file   Highest education level: GED or  equivalent  Occupational History   Not on file  Tobacco Use   Smoking status: Never   Smokeless tobacco: Never  Vaping Use   Vaping status: Never Used  Substance and Sexual Activity   Alcohol use: Never   Drug use: Never   Sexual activity: Not Currently  Other Topics Concern   Not on file  Social History Narrative   Not on file   Social Drivers of Health   Financial Resource Strain: Not on file  Food Insecurity: No Food Insecurity (06/21/2022)   Hunger Vital Sign    Worried About Running Out of Food in the Last Year: Never true    Ran Out of Food in the Last Year: Never true  Transportation Needs: No Transportation Needs (06/21/2022)   PRAPARE - Administrator, Civil Service (Medical): No    Lack of Transportation (Non-Medical): No  Physical Activity: Not on file  Stress: Not on file  Social Connections: Not on file  Intimate Partner Violence: Not on file      Review of Systems  Vital Signs: BP 180/100  P 95 T 98.3 99 % Sat There were no vitals taken for this visit.   Physical Exam Lung CTS CV- Normal Neck - right lymph nose mild enlarged and tender  Assessment/Plan: HTN-  increase Lisinopril  BID DM- order all labs F/u after lab  General Counseling: Kristi Compton verbalizes understanding of the findings of todays visit and agrees with plan of treatment. I have discussed any further diagnostic evaluation that may be needed or ordered today. We also reviewed her medications today. she has been encouraged to call the office with any questions or concerns that should arise related to todays visit.    No orders of the defined types were placed in this encounter.   No orders of the defined types were placed in this encounter.       RANSOM OTHER

## 2024-04-04 ENCOUNTER — Other Ambulatory Visit: Payer: Self-pay | Admitting: Internal Medicine

## 2024-04-05 LAB — COMPREHENSIVE METABOLIC PANEL WITH GFR
ALT: 14 IU/L (ref 0–32)
AST: 15 IU/L (ref 0–40)
Albumin: 4.1 g/dL (ref 3.9–4.9)
Alkaline Phosphatase: 64 IU/L (ref 41–116)
BUN/Creatinine Ratio: 20 (ref 9–23)
BUN: 10 mg/dL (ref 6–24)
Bilirubin Total: 0.5 mg/dL (ref 0.0–1.2)
CO2: 19 mmol/L — ABNORMAL LOW (ref 20–29)
Calcium: 8.9 mg/dL (ref 8.7–10.2)
Chloride: 100 mmol/L (ref 96–106)
Creatinine, Ser: 0.49 mg/dL — ABNORMAL LOW (ref 0.57–1.00)
Globulin, Total: 2.9 g/dL (ref 1.5–4.5)
Glucose: 225 mg/dL — ABNORMAL HIGH (ref 70–99)
Potassium: 4.4 mmol/L (ref 3.5–5.2)
Sodium: 135 mmol/L (ref 134–144)
Total Protein: 7 g/dL (ref 6.0–8.5)
eGFR: 115 mL/min/1.73 (ref >59)

## 2024-04-05 LAB — CBC WITH DIFFERENTIAL/PLATELET
Basophils Absolute: 0.1 x10E3/uL (ref 0.0–0.2)
Basos: 1 %
EOS (ABSOLUTE): 0.2 x10E3/uL (ref 0.0–0.4)
Eos: 2 %
Hematocrit: 37.1 % (ref 34.0–46.6)
Hemoglobin: 10 g/dL — ABNORMAL LOW (ref 11.1–15.9)
Immature Grans (Abs): 0 x10E3/uL (ref 0.0–0.1)
Immature Granulocytes: 0 %
Lymphocytes Absolute: 3.5 x10E3/uL — ABNORMAL HIGH (ref 0.7–3.1)
Lymphs: 47 %
MCH: 18.5 pg — ABNORMAL LOW (ref 26.6–33.0)
MCHC: 27 g/dL — ABNORMAL LOW (ref 31.5–35.7)
MCV: 69 fL — ABNORMAL LOW (ref 79–97)
Monocytes Absolute: 0.5 x10E3/uL (ref 0.1–0.9)
Monocytes: 6 %
Neutrophils Absolute: 3.3 x10E3/uL (ref 1.4–7.0)
Neutrophils: 44 %
Platelets: 255 x10E3/uL (ref 150–450)
RBC: 5.42 x10E6/uL — ABNORMAL HIGH (ref 3.77–5.28)
RDW: 17.2 % — ABNORMAL HIGH (ref 11.7–15.4)
WBC: 7.5 x10E3/uL (ref 3.4–10.8)

## 2024-04-05 LAB — LIPID PANEL W/O CHOL/HDL RATIO
Cholesterol, Total: 173 mg/dL (ref 100–199)
HDL: 41 mg/dL (ref >39)
LDL Chol Calc (NIH): 104 mg/dL — ABNORMAL HIGH (ref 0–99)
Triglycerides: 156 mg/dL — ABNORMAL HIGH (ref 0–149)
VLDL Cholesterol Cal: 28 mg/dL (ref 5–40)

## 2024-04-05 LAB — URINALYSIS, ROUTINE W REFLEX MICROSCOPIC
Bilirubin, UA: NEGATIVE
Ketones, UA: NEGATIVE
Leukocytes,UA: NEGATIVE
Nitrite, UA: NEGATIVE
Specific Gravity, UA: >=1.03 — AB (ref 1.005–1.030)
Urobilinogen, Ur: 0.2 mg/dL (ref 0.2–1.0)
pH, UA: 5.5 (ref 5.0–7.5)

## 2024-04-05 LAB — MICROSCOPIC EXAMINATION
Casts: NONE SEEN /LPF
Epithelial Cells (non renal): >10 /HPF — AB (ref 0–10)
RBC, Urine: NONE SEEN /HPF (ref 0–2)

## 2024-04-05 LAB — TSH: TSH: 2.69 u[IU]/mL (ref 0.450–4.500)

## 2024-04-05 LAB — HGB A1C W/O EAG: Hgb A1c MFr Bld: 10.8 % — ABNORMAL HIGH (ref 4.8–5.6)

## 2024-04-11 ENCOUNTER — Ambulatory Visit
Admission: RE | Admit: 2024-04-11 | Discharge: 2024-04-11 | Disposition: A | Discharge status: Home / Self Care | Payer: Self-pay | Source: Home / Self Care | Attending: Orthopedic Surgery | Admitting: Orthopedic Surgery

## 2024-04-11 ENCOUNTER — Ambulatory Visit
Admission: RE | Admit: 2024-04-11 | Discharge: 2024-04-11 | Disposition: A | Discharge status: Home / Self Care | Payer: Self-pay | Source: Ambulatory Visit | Attending: Orthopedic Surgery | Admitting: Orthopedic Surgery

## 2024-04-11 DIAGNOSIS — M170 Bilateral primary osteoarthritis of knee: Secondary | ICD-10-CM

## 2024-04-14 ENCOUNTER — Other Ambulatory Visit: Payer: Self-pay

## 2024-04-14 ENCOUNTER — Ambulatory Visit: Payer: Self-pay | Admitting: Family Medicine

## 2024-04-14 VITALS — BP 130/80 | HR 76 | Ht 66.0 in | Wt 158.6 lb

## 2024-04-14 DIAGNOSIS — E119 Type 2 diabetes mellitus without complications: Secondary | ICD-10-CM

## 2024-04-14 DIAGNOSIS — N6011 Diffuse cystic mastopathy of right breast: Secondary | ICD-10-CM

## 2024-04-14 DIAGNOSIS — N898 Other specified noninflammatory disorders of vagina: Secondary | ICD-10-CM

## 2024-04-14 DIAGNOSIS — Z0001 Encounter for general adult medical examination with abnormal findings: Secondary | ICD-10-CM

## 2024-04-14 DIAGNOSIS — E1165 Type 2 diabetes mellitus with hyperglycemia: Secondary | ICD-10-CM

## 2024-04-14 MED ORDER — METFORMIN HCL 500 MG PO TABS: 500.0000 mg | ORAL_TABLET | Freq: Two times a day (BID) | ORAL | 0 refills | Status: AC

## 2024-04-14 NOTE — Progress Notes (Signed)
 Type 2 dm HTN RESTRAT metformin  500 mg bid  F/u 3 month Mostt likely she needs 1 gm of metformin  bid  But she wants to start low

## 2024-06-30 ENCOUNTER — Ambulatory Visit: Payer: Self-pay | Admitting: Internal Medicine

## 2024-07-14 ENCOUNTER — Ambulatory Visit: Payer: Self-pay | Admitting: Internal Medicine
